# Patient Record
Sex: Female | Born: 1970 | Race: Black or African American | Hispanic: No | Smoking: Current every day smoker
Health system: Southern US, Community
[De-identification: ages and names within clinical notes are randomized; demographics above are authoritative.]

## PROBLEM LIST (undated history)

## (undated) DIAGNOSIS — I10 Essential (primary) hypertension: Secondary | ICD-10-CM

## (undated) DIAGNOSIS — E079 Disorder of thyroid, unspecified: Secondary | ICD-10-CM

## (undated) DIAGNOSIS — E119 Type 2 diabetes mellitus without complications: Secondary | ICD-10-CM

## (undated) HISTORY — PX: BREAST CYST ASPIRATION: SHX578

## (undated) HISTORY — PX: TUBAL LIGATION: SHX77

---

## 2004-09-09 ENCOUNTER — Ambulatory Visit: Payer: Self-pay | Admitting: Family Medicine

## 2004-09-28 ENCOUNTER — Ambulatory Visit: Payer: Self-pay | Admitting: Unknown Physician Specialty

## 2004-09-30 ENCOUNTER — Ambulatory Visit: Payer: Self-pay

## 2004-12-12 ENCOUNTER — Ambulatory Visit: Payer: Self-pay | Admitting: Family Medicine

## 2004-12-26 ENCOUNTER — Ambulatory Visit: Payer: Self-pay | Admitting: Family Medicine

## 2005-03-31 ENCOUNTER — Ambulatory Visit: Payer: Self-pay | Admitting: Family Medicine

## 2005-04-05 ENCOUNTER — Ambulatory Visit: Payer: Self-pay | Admitting: Family Medicine

## 2005-06-15 ENCOUNTER — Ambulatory Visit: Payer: Self-pay | Admitting: Family Medicine

## 2005-06-28 ENCOUNTER — Ambulatory Visit: Payer: Self-pay | Admitting: Surgery

## 2005-08-09 ENCOUNTER — Ambulatory Visit: Payer: Self-pay | Admitting: Physician Assistant

## 2005-10-12 ENCOUNTER — Ambulatory Visit: Payer: Self-pay | Admitting: Pain Medicine

## 2005-10-18 ENCOUNTER — Ambulatory Visit: Payer: Self-pay | Admitting: Pain Medicine

## 2005-10-20 ENCOUNTER — Ambulatory Visit: Payer: Self-pay | Admitting: Obstetrics and Gynecology

## 2006-09-19 ENCOUNTER — Emergency Department: Payer: Self-pay | Admitting: Emergency Medicine

## 2008-01-18 ENCOUNTER — Emergency Department: Payer: Self-pay | Admitting: Internal Medicine

## 2008-01-27 ENCOUNTER — Emergency Department: Payer: Self-pay | Admitting: Emergency Medicine

## 2008-08-03 ENCOUNTER — Ambulatory Visit: Payer: Self-pay | Admitting: Family Medicine

## 2009-01-12 ENCOUNTER — Ambulatory Visit: Payer: Self-pay | Admitting: Gynecologic Oncology

## 2009-02-09 ENCOUNTER — Ambulatory Visit: Payer: Self-pay | Admitting: Gynecologic Oncology

## 2009-05-03 ENCOUNTER — Emergency Department: Payer: Self-pay | Admitting: Emergency Medicine

## 2010-06-14 ENCOUNTER — Ambulatory Visit: Payer: Self-pay | Admitting: Internal Medicine

## 2010-07-06 ENCOUNTER — Inpatient Hospital Stay: Payer: Self-pay | Admitting: Internal Medicine

## 2010-07-14 ENCOUNTER — Ambulatory Visit: Payer: Self-pay | Admitting: Internal Medicine

## 2011-11-21 ENCOUNTER — Ambulatory Visit: Payer: Self-pay

## 2012-01-04 ENCOUNTER — Ambulatory Visit: Payer: Self-pay | Admitting: General Surgery

## 2012-01-04 LAB — BASIC METABOLIC PANEL
Anion Gap: 7 (ref 7–16)
BUN: 19 mg/dL — ABNORMAL HIGH (ref 7–18)
Calcium, Total: 8.5 mg/dL (ref 8.5–10.1)
Chloride: 109 mmol/L — ABNORMAL HIGH (ref 98–107)
Co2: 23 mmol/L (ref 21–32)
Creatinine: 0.83 mg/dL (ref 0.60–1.30)
Osmolality: 286 (ref 275–301)

## 2012-01-04 LAB — CBC WITH DIFFERENTIAL/PLATELET
Basophil #: 0 10*3/uL (ref 0.0–0.1)
Basophil %: 0.4 %
Eosinophil #: 0.1 10*3/uL (ref 0.0–0.7)
Eosinophil %: 1.4 %
HGB: 12.3 g/dL (ref 12.0–16.0)
Lymphocyte %: 43.6 %
MCH: 32.6 pg (ref 26.0–34.0)
MCV: 99 fL (ref 80–100)
Monocyte #: 0.4 x10 3/mm (ref 0.2–0.9)
Monocyte %: 7 %
WBC: 5.2 10*3/uL (ref 3.6–11.0)

## 2012-01-05 ENCOUNTER — Ambulatory Visit: Payer: Self-pay | Admitting: General Surgery

## 2012-01-05 DIAGNOSIS — Z01810 Encounter for preprocedural cardiovascular examination: Secondary | ICD-10-CM

## 2012-01-05 LAB — PREGNANCY, URINE: Pregnancy Test, Urine: NEGATIVE m[IU]/mL

## 2012-01-08 LAB — PATHOLOGY REPORT

## 2013-03-27 ENCOUNTER — Emergency Department: Payer: Self-pay | Admitting: Emergency Medicine

## 2014-12-06 NOTE — Op Note (Signed)
PATIENT NAME:  Tammy BodilyMEBANE, [name] MR#:  [id number] DATE OF BIRTH:  [birth year]  DATE OF PROCEDURE:  01/05/2012  PREOPERATIVE DIAGNOSIS: Left breast abscess.   POSTOPERATIVE DIAGNOSIS: Left breast abscess.   OPERATIVE PROCEDURE: Excision of left breast abscess with central duct.   SURGEON: Donnalee CurryJeffrey Wacey Zieger, MD   ANESTHESIA: General by LMA under Dr. Rubye OaksPalmer, Marcaine 0.5% plain, 30 mL local infiltration.   ANESTHESIOLOGIST: Heriberto AntiguaScott Palmer, MD   CLINICAL NOTE: This 44 year old woman has developed a recurrent left breast abscess. She was felt to be a candidate for excision due to marked local pain.   OPERATIVE NOTE: With the patient under adequate general anesthesia, the area was prepped with ChloraPrep and draped. Marcaine was infiltrated for postoperative analgesia. The mass was at the 11-12 o'clock position beneath the areola. A radial incision was made along this course from the base of the nipple to the edge of the areola. Skin was incised sharply and the remaining dissection completed with electrocautery. The inflammatory process was entered and a small amount of purulence obtained. Culture was sent for aerobic and anaerobic organisms. The inflammatory tissue was incised in its entirety. A lacrimal duct probe was passed through the connecting duct, and then a 3.5 mm punch biopsy was used to extract the duct from the adjacent tissue. This entire specimen was sent in formalin for routine histology. This nipple end was tagged with a 2-0 Vicryl suture. Excellent hemostasis was achieved. The area was irrigated with saline. The wound was closed in layers with 2-0 Vicryl figure-of-eight sutures. The skin was loosely approximated with 4-0 nylon sutures. A dry dressing with Telfa and Tegaderm was applied.       The patient tolerated the procedure well and was taken to the recovery room in stable condition. ____________________________ Earline MayotteJeffrey W. Reign Bartnick, MD jwb:cbb D: 01/05/2012 12:11:30 ET T: 01/05/2012  19:52:05 ET JOB#: [id number]  cc: Earline MayotteJeffrey W. Abbegayle Denault, MD, <Dictator> Leanna SatoLinda M. Miles, MD [facility] - [facility] Shaver Durk Carmen Brion AlimentW Riordan Walle MD ELECTRONICALLY SIGNED 01/08/2012 12:34

## 2015-11-11 ENCOUNTER — Encounter: Payer: Self-pay | Admitting: Emergency Medicine

## 2015-11-11 ENCOUNTER — Emergency Department
Admission: EM | Admit: 2015-11-11 | Discharge: 2015-11-11 | Disposition: A | Discharge status: Home / Self Care | Payer: BLUE CROSS/BLUE SHIELD | Attending: Emergency Medicine | Admitting: Emergency Medicine

## 2015-11-11 DIAGNOSIS — I10 Essential (primary) hypertension: Secondary | ICD-10-CM | POA: Insufficient documentation

## 2015-11-11 DIAGNOSIS — J0140 Acute pansinusitis, unspecified: Secondary | ICD-10-CM

## 2015-11-11 DIAGNOSIS — F17210 Nicotine dependence, cigarettes, uncomplicated: Secondary | ICD-10-CM | POA: Diagnosis not present

## 2015-11-11 DIAGNOSIS — E119 Type 2 diabetes mellitus without complications: Secondary | ICD-10-CM | POA: Diagnosis not present

## 2015-11-11 DIAGNOSIS — R0981 Nasal congestion: Secondary | ICD-10-CM | POA: Diagnosis present

## 2015-11-11 HISTORY — DX: Type 2 diabetes mellitus without complications: E11.9

## 2015-11-11 HISTORY — DX: Disorder of thyroid, unspecified: E07.9

## 2015-11-11 HISTORY — DX: Essential (primary) hypertension: I10

## 2015-11-11 MED ORDER — AMOXICILLIN-POT CLAVULANATE 875-125 MG PO TABS
1.0000 | ORAL_TABLET | Freq: Once | ORAL | Status: AC
  Administered 2015-11-11: 1 via ORAL
  Filled 2015-11-11: qty 1

## 2015-11-11 MED ORDER — HYDROCODONE-ACETAMINOPHEN 5-325 MG PO TABS: 1.0000 | ORAL_TABLET | ORAL | Status: DC | PRN

## 2015-11-11 MED ORDER — AMOXICILLIN-POT CLAVULANATE 875-125 MG PO TABS: 1.0000 | ORAL_TABLET | Freq: Two times a day (BID) | ORAL | Status: AC

## 2015-11-11 NOTE — ED Notes (Signed)
Patient discharged to home per MD order. Patient in stable condition, and deemed medically cleared by ED provider for discharge. Discharge instructions reviewed with patient/family using "Teach Back"; verbalized understanding of medication education and administration, and information about follow-up care. Denies further concerns. ° °

## 2015-11-11 NOTE — Discharge Instructions (Signed)
Sinusitis, Adult Sinusitis is redness, soreness, and puffiness (inflammation) of the air pockets in the bones of your face (sinuses). The redness, soreness, and puffiness can cause air and mucus to get trapped in your sinuses. This can allow germs to grow and cause an infection.  HOME CARE   Drink enough fluids to keep your pee (urine) clear or pale yellow.  Use a humidifier in your home.  Run a hot shower to create steam in the bathroom. Sit in the bathroom with the door closed. Breathe in the steam 3-4 times a day.  Put a warm, moist washcloth on your face 3-4 times a day, or as told by your doctor.  Use salt water sprays (saline sprays) to wet the thick fluid in your nose. This can help the sinuses drain.  Only take medicine as told by your doctor. GET HELP RIGHT AWAY IF:   Your pain gets worse.  You have very bad headaches.  You are sick to your stomach (nauseous).  You throw up (vomit).  You are very sleepy (drowsy) all the time.  Your face is puffy (swollen).  Your vision changes.  You have a stiff neck.  You have trouble breathing. MAKE SURE YOU:   Understand these instructions.  Will watch your condition.  Will get help right away if you are not doing well or get worse.   This information is not intended to replace advice given to you by your health care provider. Make sure you discuss any questions you have with your health care provider.   Document Released: 01/17/2008 Document Revised: 08/21/2014 Document Reviewed: 03/05/2012 Elsevier Interactive Patient Education 2016 Elsevier Inc.   Take Coricidin HBP if needed for congestion as this will not elevated blood pressure. Discontinue taking Sudafed. Continue taking blood pressure medicine daily. Follow-up with your doctor if any continued problems. Begin taking Augmentin twice a day until finished. You may take Tylenol or ibuprofen as needed for pain or headache.

## 2015-11-11 NOTE — ED Notes (Signed)
Pt presents to ED with sinus pain, congestion, dry cough, and ear pain. Pt states she has been feeling badly all day and thinks she may have a sinus infection. Denies fever.

## 2015-11-11 NOTE — ED Provider Notes (Signed)
[facility] Emergency Department Provider Note ____________________________________________  Time seen: Approximately 10:59 PM  I have reviewed the triage vital signs and the nursing notes.   HISTORY  Chief Complaint Facial Pain; Otalgia; and Nasal Congestion   HPI Tammy Costa is a 45 y.o. female is here complaining of sinus pain, congestion, nonproductive cough and ear pain. Patient states that she has been feeling bad all day and has been unaware of any fever or chills. She states that at approximately 5:00 she took Sudafed 30 mg tablets 2 by mouth for congestion despite the fact that she has hypertension. She states she has taken her blood pressure medicine today. She also states that along with her facial pain she is having dental pain. Currently she rates her pain as a 10 over 10 and states she left work tonight come to the emergency room.   Past Medical History  Diagnosis Date  . Diabetes mellitus without complication (HCC)   . Hypertension   . Thyroid disease     There are no active problems to display for this patient.   Past Surgical History  Procedure Laterality Date  . Tubal ligation      Current Outpatient Rx  Name  Route  Sig  Dispense  Refill  . amoxicillin-clavulanate (AUGMENTIN) 875-125 MG tablet   Oral   Take 1 tablet by mouth 2 (two) times daily.   20 tablet   0   . HYDROcodone-acetaminophen (NORCO/VICODIN) 5-325 MG tablet   Oral   Take 1 tablet by mouth every 4 (four) hours as needed for moderate pain.   20 tablet   0     Allergies Sulfa antibiotics  No family history on file.  Social History Social History  Substance Use Topics  . Smoking status: Current Every Day Smoker    Types: Cigarettes  . Smokeless tobacco: None  . Alcohol Use: Yes    Review of Systems Constitutional: No fever/chills ENT: No sore throat. Cardiovascular: Denies chest pain. Respiratory: Denies shortness of breath. Positive  nonproductive cough. Gastrointestinal:  No nausea, no vomiting.   Musculoskeletal: Negative for back pain. Skin: Negative for rash. Neurological: Positive for frontal headaches, no focal weakness or numbness.  10-point ROS otherwise negative.  ____________________________________________   PHYSICAL EXAM:  VITAL SIGNS: ED Triage Vitals  Enc Vitals Group     BP 11/11/15 2201 160/98 mmHg     Pulse Rate 11/11/15 2201 97     Resp 11/11/15 2201 20     Temp 11/11/15 2201 98.3 F (36.8 C)     Temp Source 11/11/15 2201 Oral     SpO2 11/11/15 2201 97 %     Weight 11/11/15 2201 156 lb (70.761 kg)     Height 11/11/15 2201  (1.651 m)     Head Cir --      Peak Flow --      Pain Score 11/11/15 2201 10     Pain Loc --      Pain Edu? --      Excl. in GC? --     Constitutional: Alert and oriented. Well appearing and in no acute distress. Eyes: Conjunctivae are normal. PERRL. EOMI. Head: Atraumatic. Nose: Moderate congestion/rhinnorhea. EACs are clear. TMs are dull with poor light reflex. No injection or erythema was noted. Moderate tenderness on percussion of the frontal and maxillary sinuses Mouth/Throat: Mucous membranes are moist.  Oropharynx non-erythematous. Positive posterior drainage. Neck: No stridor.   Hematological/Lymphatic/Immunilogical: No cervical lymphadenopathy. Cardiovascular: Normal  rate, regular rhythm. Grossly normal heart sounds.  Good peripheral circulation. Respiratory: Normal respiratory effort.  No retractions. Lungs CTAB. Musculoskeletal: Moves upper and lower extremities without any difficulty. Normal gait was noted. Neurologic:  Normal speech and language. No gross focal neurologic deficits are appreciated. No gait instability. Skin:  Skin is warm, dry and intact. No rash noted. Psychiatric: Mood and affect are normal. Speech and behavior are normal.  ____________________________________________   LABS (all labs ordered are listed, but only abnormal  results are displayed)  Labs Reviewed - No data to display  PROCEDURES  Procedure(s) performed: None  Critical Care performed: No  ____________________________________________   INITIAL IMPRESSION / ASSESSMENT AND PLAN / ED COURSE  Pertinent labs & imaging results that were available during my care of the patient were reviewed by me and considered in my medical decision making (see chart for details).  Patient was started on Augmentin 875 mg twice a day for 10 days. She is also given a prescription for Norco as needed for pain. She requested a note to remain out of work. She [facility] if any continued problems. ____________________________________________   FINAL CLINICAL IMPRESSION(S) / ED DIAGNOSES  Final diagnoses:  Acute pansinusitis, recurrence not specified      Tommi RumpsRhonda L Shubham Thackston, PA-C 11/11/15 2318  Phineas SemenGraydon Goodman, MD 11/13/15 [phone]

## 2015-12-09 ENCOUNTER — Other Ambulatory Visit: Payer: Self-pay | Admitting: Nurse Practitioner

## 2015-12-09 DIAGNOSIS — Z1231 Encounter for screening mammogram for malignant neoplasm of breast: Secondary | ICD-10-CM

## 2016-06-23 ENCOUNTER — Ambulatory Visit: Payer: BLUE CROSS/BLUE SHIELD | Attending: Otolaryngology

## 2016-06-23 DIAGNOSIS — I10 Essential (primary) hypertension: Secondary | ICD-10-CM | POA: Diagnosis not present

## 2016-06-23 DIAGNOSIS — E669 Obesity, unspecified: Secondary | ICD-10-CM | POA: Diagnosis not present

## 2016-06-23 DIAGNOSIS — G4733 Obstructive sleep apnea (adult) (pediatric): Secondary | ICD-10-CM | POA: Diagnosis not present

## 2016-06-23 DIAGNOSIS — Z6841 Body Mass Index (BMI) 40.0 and over, adult: Secondary | ICD-10-CM | POA: Diagnosis not present

## 2016-06-23 DIAGNOSIS — R0683 Snoring: Secondary | ICD-10-CM | POA: Diagnosis present

## 2016-07-25 ENCOUNTER — Other Ambulatory Visit: Payer: Self-pay | Admitting: Family Medicine

## 2016-07-25 DIAGNOSIS — Z1239 Encounter for other screening for malignant neoplasm of breast: Secondary | ICD-10-CM

## 2016-09-12 ENCOUNTER — Other Ambulatory Visit: Payer: Self-pay | Admitting: Family Medicine

## 2016-10-10 ENCOUNTER — Ambulatory Visit
Admission: RE | Admit: 2016-10-10 | Discharge: 2016-10-10 | Disposition: A | Discharge status: Home / Self Care | Payer: 59 | Source: Ambulatory Visit | Attending: Family Medicine | Admitting: Family Medicine

## 2016-10-10 DIAGNOSIS — Z1231 Encounter for screening mammogram for malignant neoplasm of breast: Secondary | ICD-10-CM | POA: Insufficient documentation

## 2016-10-10 DIAGNOSIS — Z1239 Encounter for other screening for malignant neoplasm of breast: Secondary | ICD-10-CM

## 2017-11-21 ENCOUNTER — Other Ambulatory Visit: Payer: Self-pay | Admitting: Family Medicine

## 2017-11-21 DIAGNOSIS — Z1231 Encounter for screening mammogram for malignant neoplasm of breast: Secondary | ICD-10-CM

## 2017-12-05 ENCOUNTER — Ambulatory Visit
Admission: RE | Admit: 2017-12-05 | Discharge: 2017-12-05 | Disposition: A | Discharge status: Home / Self Care | Payer: BLUE CROSS/BLUE SHIELD | Source: Ambulatory Visit | Attending: Family Medicine | Admitting: Family Medicine

## 2017-12-05 DIAGNOSIS — Z1231 Encounter for screening mammogram for malignant neoplasm of breast: Secondary | ICD-10-CM | POA: Insufficient documentation

## 2018-12-04 ENCOUNTER — Other Ambulatory Visit: Payer: Self-pay | Admitting: Family Medicine

## 2018-12-04 DIAGNOSIS — Z1231 Encounter for screening mammogram for malignant neoplasm of breast: Secondary | ICD-10-CM

## 2019-01-21 ENCOUNTER — Ambulatory Visit
Admission: RE | Admit: 2019-01-21 | Discharge: 2019-01-21 | Disposition: A | Discharge status: Home / Self Care | Payer: BC Managed Care – PPO | Source: Ambulatory Visit | Attending: Family Medicine | Admitting: Family Medicine

## 2019-01-21 ENCOUNTER — Other Ambulatory Visit: Payer: Self-pay

## 2019-01-21 DIAGNOSIS — Z1231 Encounter for screening mammogram for malignant neoplasm of breast: Secondary | ICD-10-CM | POA: Insufficient documentation

## 2019-07-14 ENCOUNTER — Other Ambulatory Visit: Payer: Self-pay

## 2019-07-14 DIAGNOSIS — Z20822 Contact with and (suspected) exposure to covid-19: Secondary | ICD-10-CM

## 2019-07-16 LAB — NOVEL CORONAVIRUS, NAA: SARS-CoV-2, NAA: NOT DETECTED

## 2019-09-19 ENCOUNTER — Ambulatory Visit: Payer: Self-pay | Admitting: Urology

## 2019-10-25 ENCOUNTER — Other Ambulatory Visit: Payer: Self-pay

## 2019-10-25 ENCOUNTER — Ambulatory Visit: Payer: Self-pay | Attending: Internal Medicine

## 2019-10-25 DIAGNOSIS — Z23 Encounter for immunization: Secondary | ICD-10-CM

## 2019-10-25 NOTE — Progress Notes (Signed)
   Covid-19 Vaccination Clinic  Name:  Tammy Costa    MRN: [id number] DOB: Dec 21, 1970  10/25/2019  Tammy Costa was observed post Covid-19 immunization for 15 minutes without incident. She was provided with Vaccine Information Sheet and instruction to access the V-Safe system.   Tammy Costa was instructed to call 911 with any severe reactions post vaccine: Marland Kitchen Difficulty breathing  . Swelling of face and throat  . A fast heartbeat  . A bad rash all over body  . Dizziness and weakness   Immunizations Administered    Name Date Dose VIS Date Route   Pfizer COVID-19 Vaccine 10/25/2019 10:17 AM 0.3 mL 07/25/2019 Intramuscular   Manufacturer: [employer], [employer]   Lot: [id number]   NDC: [address]-1

## 2019-11-08 ENCOUNTER — Ambulatory Visit: Payer: BC Managed Care – PPO

## 2019-11-19 ENCOUNTER — Ambulatory Visit: Payer: Self-pay | Attending: Internal Medicine

## 2019-11-19 DIAGNOSIS — Z23 Encounter for immunization: Secondary | ICD-10-CM

## 2019-11-19 NOTE — Progress Notes (Signed)
   Covid-19 Vaccination Clinic  Name:  PHILLIPA MORDEN    MRN: [id number] DOB: [birth year]  11/19/2019  Ms. Mebane-Killough was observed post Covid-19 immunization for 15 minutes without incident. She was provided with Vaccine Information Sheet and instruction to access the V-Safe system.   Ms. Dimon was instructed to call 911 with any severe reactions post vaccine: Marland Kitchen Difficulty breathing  . Swelling of face and throat  . A fast heartbeat  . A bad rash all over body  . Dizziness and weakness   Immunizations Administered    Name Date Dose VIS Date Route   Pfizer COVID-19 Vaccine 11/19/2019  9:35 AM 0.3 mL 07/25/2019 Intramuscular   Manufacturer: [employer], [employer]   Lot: [id number] [id number]   NDC: [address]-7

## 2020-01-06 ENCOUNTER — Other Ambulatory Visit: Payer: Self-pay | Admitting: Family Medicine

## 2020-01-06 DIAGNOSIS — Z1231 Encounter for screening mammogram for malignant neoplasm of breast: Secondary | ICD-10-CM

## 2020-01-23 ENCOUNTER — Ambulatory Visit
Admission: RE | Admit: 2020-01-23 | Discharge: 2020-01-23 | Disposition: A | Discharge status: Home / Self Care | Payer: BC Managed Care – PPO | Source: Ambulatory Visit | Attending: Family Medicine | Admitting: Family Medicine

## 2020-01-23 DIAGNOSIS — Z1231 Encounter for screening mammogram for malignant neoplasm of breast: Secondary | ICD-10-CM

## 2020-01-27 ENCOUNTER — Other Ambulatory Visit: Payer: Self-pay | Admitting: Family Medicine

## 2020-01-27 DIAGNOSIS — R928 Other abnormal and inconclusive findings on diagnostic imaging of breast: Secondary | ICD-10-CM

## 2020-01-27 DIAGNOSIS — R921 Mammographic calcification found on diagnostic imaging of breast: Secondary | ICD-10-CM

## 2020-01-30 ENCOUNTER — Other Ambulatory Visit: Payer: Self-pay

## 2020-01-30 ENCOUNTER — Encounter: Payer: Self-pay | Admitting: Urology

## 2020-01-30 ENCOUNTER — Ambulatory Visit: Payer: BC Managed Care – PPO | Admitting: Urology

## 2020-01-30 DIAGNOSIS — R350 Frequency of micturition: Secondary | ICD-10-CM | POA: Diagnosis not present

## 2020-01-30 LAB — BLADDER SCAN AMB NON-IMAGING: Scan Result: 0

## 2020-01-30 MED ORDER — UROGESIC-BLUE 81.6 MG PO TABS: 1.0000 | ORAL_TABLET | Freq: Three times a day (TID) | ORAL | 3 refills | Status: AC | PRN

## 2020-01-30 NOTE — Patient Instructions (Signed)
Cystoscopy Cystoscopy is a procedure that is used to help diagnose and sometimes treat conditions that affect the lower urinary tract. The lower urinary tract includes the bladder and the urethra. The urethra is the tube that drains urine from the bladder. Cystoscopy is done using a thin, tube-shaped instrument with a light and camera at the end (cystoscope). The cystoscope may be hard or flexible, depending on the goal of the procedure. The cystoscope is inserted through the urethra, into the bladder. Cystoscopy may be recommended if you have:  Urinary tract infections that keep coming back.  Blood in the urine (hematuria).  An inability to control when you urinate (urinary incontinence) or an overactive bladder.  Unusual cells found in a urine sample.  A blockage in the urethra, such as a urinary stone.  Painful urination.  An abnormality in the bladder found during an intravenous pyelogram (IVP) or CT scan. Cystoscopy may also be done to remove a sample of tissue to be examined under a microscope (biopsy). Tell a health care provider about:  Any allergies you have.  All medicines you are taking, including vitamins, herbs, eye drops, creams, and over-the-counter medicines.  Any problems you or family members have had with anesthetic medicines.  Any blood disorders you have.  Any surgeries you have had.  Any medical conditions you have.  Whether you are pregnant or may be pregnant. What are the risks? Generally, this is a safe procedure. However, problems may occur, including:  Infection.  Bleeding.  Allergic reactions to medicines.  Damage to other structures or organs. What happens before the procedure?  Ask your health care provider about: ? Changing or stopping your regular medicines. This is especially important if you are taking diabetes medicines or blood thinners. ? Taking medicines such as aspirin and ibuprofen. These medicines can thin your blood. Do not take  these medicines unless your health care provider tells you to take them. ? Taking over-the-counter medicines, vitamins, herbs, and supplements.  Follow instructions from your health care provider about eating or drinking restrictions.  Ask your health care provider what steps will be taken to help prevent infection. These may include: ? Washing skin with a germ-killing soap. ? Taking antibiotic medicine.  You may have an exam or testing, such as: ? X-rays of the bladder, urethra, or kidneys. ? Urine tests to check for signs of infection.  Plan to have someone take you home from the hospital or clinic. What happens during the procedure?   You will be given one or more of the following: ? A medicine to help you relax (sedative). ? A medicine to numb the area (local anesthetic).  The area around the opening of your urethra will be cleaned.  The cystoscope will be passed through your urethra into your bladder.  Germ-free (sterile) fluid will flow through the cystoscope to fill your bladder. The fluid will stretch your bladder so that your health care provider can clearly examine your bladder walls.  Your doctor will look at the urethra and bladder. Your doctor may take a biopsy or remove stones.  The cystoscope will be removed, and your bladder will be emptied. The procedure may vary among health care providers and hospitals. What can I expect after the procedure? After the procedure, it is common to have:  Some soreness or pain in your abdomen and urethra.  Urinary symptoms. These include: ? Mild pain or burning when you urinate. Pain should stop within a few minutes after you urinate. This   may last for up to 1 week. ? A small amount of blood in your urine for several days. ? Feeling like you need to urinate but producing only a small amount of urine. Follow these instructions at home: Medicines  Take over-the-counter and prescription medicines only as told by your health care  provider.  If you were prescribed an antibiotic medicine, take it as told by your health care provider. Do not stop taking the antibiotic even if you start to feel better. General instructions  Return to your normal activities as told by your health care provider. Ask your health care provider what activities are safe for you.  Do not drive for 24 hours if you were given a sedative during your procedure.  Watch for any blood in your urine. If the amount of blood in your urine increases, call your health care provider.  Follow instructions from your health care provider about eating or drinking restrictions.  If a tissue sample was removed for testing (biopsy) during your procedure, it is up to you to get your test results. Ask your health care provider, or the department that is doing the test, when your results will be ready.  Drink enough fluid to keep your urine pale yellow.  Keep all follow-up visits as told by your health care provider. This is important. Contact a health care provider if you:  Have pain that gets worse or does not get better with medicine, especially pain when you urinate.  Have trouble urinating.  Have more blood in your urine. Get help right away if you:  Have blood clots in your urine.  Have abdominal pain.  Have a fever or chills.  Are unable to urinate. Summary  Cystoscopy is a procedure that is used to help diagnose and sometimes treat conditions that affect the lower urinary tract.  Cystoscopy is done using a thin, tube-shaped instrument with a light and camera at the end.  After the procedure, it is common to have some soreness or pain in your abdomen and urethra.  Watch for any blood in your urine. If the amount of blood in your urine increases, call your health care provider.  If you were prescribed an antibiotic medicine, take it as told by your health care provider. Do not stop taking the antibiotic even if you start to feel better. This  information is not intended to replace advice given to you by your health care provider. Make sure you discuss any questions you have with your health care provider. Document Revised: 07/23/2018 Document Reviewed: 07/23/2018 Elsevier Patient Education  2020 Elsevier Inc. Overactive Bladder, Adult  Overactive bladder refers to a condition in which a person has a sudden need to pass urine. The person may leak urine if he or she cannot get to the bathroom fast enough (urinary incontinence). A person with this condition may also wake up several times in the night to go to the bathroom. Overactive bladder is associated with poor nerve signals between your bladder and your brain. Your bladder may get the signal to empty before it is full. You may also have very sensitive muscles that make your bladder squeeze too soon. These symptoms might interfere with daily work or social activities. What are the causes? This condition may be associated with or caused by:  Urinary tract infection.  Infection of nearby tissues, such as the prostate.  Prostate enlargement.  Surgery on the uterus or urethra.  Bladder stones, inflammation, or tumors.  Drinking too much caffeine   or alcohol.  Certain medicines, especially medicines that get rid of extra fluid in the body (diuretics).  Muscle or nerve weakness, especially from: ? A spinal cord injury. ? Stroke. ? Multiple sclerosis. ? Parkinson's disease.  Diabetes.  Constipation. What increases the risk? You may be at greater risk for overactive bladder if you:  Are an older adult.  Smoke.  Are going through menopause.  Have prostate problems.  Have a neurological disease, such as stroke, dementia, Parkinson's disease, or multiple sclerosis (MS).  Eat or drink things that irritate the bladder. These include alcohol, spicy food, and caffeine.  Are overweight or obese. What are the signs or symptoms? Symptoms of this condition  include:  Sudden, strong urge to urinate.  Leaking urine.  Urinating 8 or more times a day.  Waking up to urinate 2 or more times a night. How is this diagnosed? Your health care provider may suspect overactive bladder based on your symptoms. He or she will diagnose this condition by:  A physical exam and medical history.  Blood or urine tests. You might need bladder or urine tests to help determine what is causing your overactive bladder. You might also need to see a health care provider who specializes in urinary tract problems (urologist). How is this treated? Treatment for overactive bladder depends on the cause of your condition and whether it is mild or severe. You can also make lifestyle changes at home. Options include:  Bladder training. This may include: ? Learning to control the urge to urinate by following a schedule that directs you to urinate at regular intervals (timed voiding). ? Doing Kegel exercises to strengthen your pelvic floor muscles, which support your bladder. Toning these muscles can help you control urination, even if your bladder muscles are overactive.  Special devices. This may include: ? Biofeedback, which uses sensors to help you become aware of your body's signals. ? Electrical stimulation, which uses electrodes placed inside the body (implanted) or outside the body. These electrodes send gentle pulses of electricity to strengthen the nerves or muscles that control the bladder. ? Women may use a plastic device that fits into the vagina and supports the bladder (pessary).  Medicines. ? Antibiotics to treat bladder infection. ? Antispasmodics to stop the bladder from releasing urine at the wrong time. ? Tricyclic antidepressants to relax bladder muscles. ? Injections of botulinum toxin type A directly into the bladder tissue to relax bladder muscles.  Lifestyle changes. This may include: ? Weight loss. Talk to your health care provider about weight  loss methods that would work best for you. ? Diet changes. This may include reducing how much alcohol and caffeine you consume, or drinking fluids at different times of the day. ? Not smoking. Do not use any products that contain nicotine or tobacco, such as cigarettes and e-cigarettes. If you need help quitting, ask your health care provider.  Surgery. ? A device may be implanted to help manage the nerve signals that control urination. ? An electrode may be implanted to stimulate electrical signals in the bladder. ? A procedure may be done to change the shape of the bladder. This is done only in very severe cases. Follow these instructions at home: Lifestyle  Make any diet or lifestyle changes that are recommended by your health care provider. These may include: ? Drinking less fluid or drinking fluids at different times of the day. ? Cutting down on caffeine or alcohol. ? Doing Kegel exercises. ? Losing weight if needed. ?   Eating a healthy and balanced diet to prevent constipation. This may include:  Eating foods that are high in fiber, such as fresh fruits and vegetables, whole grains, and beans.  Limiting foods that are high in fat and processed sugars, such as fried and sweet foods. General instructions  Take over-the-counter and prescription medicines only as told by your health care provider.  If you were prescribed an antibiotic medicine, take it as told by your health care provider. Do not stop taking the antibiotic even if you start to feel better.  Use any implants or pessary as told by your health care provider.  If needed, wear pads to absorb urine leakage.  Keep a journal or log to track how much and when you drink and when you feel the need to urinate. This will help your health care provider monitor your condition.  Keep all follow-up visits as told by your health care provider. This is important. Contact a health care provider if:  You have a fever.  Your  symptoms do not get better with treatment.  Your pain and discomfort get worse.  You have more frequent urges to urinate. Get help right away if:  You are not able to control your bladder. Summary  Overactive bladder refers to a condition in which a person has a sudden need to pass urine.  Several conditions may lead to an overactive bladder.  Treatment for overactive bladder depends on the cause and severity of your condition.  Follow your health care provider's instructions about lifestyle changes, doing Kegel exercises, keeping a journal, and taking medicines. This information is not intended to replace advice given to you by your health care provider. Make sure you discuss any questions you have with your health care provider. Document Revised: 11/21/2018 Document Reviewed: 08/16/2017 Elsevier Patient Education  2020 Elsevier Inc.  

## 2020-01-30 NOTE — Progress Notes (Signed)
01/30/2020 9:10 AM   Tammy Costa 06-Feb-1971 [id number]  Referring provider: Leanna Sato, MD [address] [place],  [place] [address]  Chief Complaint  Patient presents with   New Patient (Initial Visit)    UTI    HPI: Tammy Costa was referred over for frequency, urgency and "heaviness" in the pelvic area. No prolapse symptoms. She may have had a UTI. No dysuria. Pyridium hasn't helped. She was treated with nitrofurantoin, cephalexin and cipro but her symptoms improved. 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 A follow-up UA showed negative nitrite, negative leukocyte esterase, 3+ glucose.  Culture was mixed.  She has no gross hematuria. Good flow. She has a yearly Gyn exam.   No NG risk. She has diabetes mellitus. Pelvic surgery includes tubal ligation. # NSVD.   UA today with a few bacteria. No rbcs. PVR 0 ml.    PMH: Past Medical History:  Diagnosis Date   Diabetes mellitus without complication (HCC)    Hypertension    Thyroid disease     Surgical History: Past Surgical History:  Procedure Laterality Date   BREAST CYST ASPIRATION Left    neg   TUBAL LIGATION      Home Medications:  Allergies as of 01/30/2020      Reactions   Sulfa Antibiotics Rash, Itching, Swelling   Sulfasalazine Rash      Medication List       Accurate as of January 30, 2020  9:10 AM. If you have any questions, ask your nurse or doctor.        HYDROcodone-acetaminophen 5-325 MG tablet Commonly known as: NORCO/VICODIN Take 1 tablet by mouth every 4 (four) hours as needed for moderate pain.       Allergies:  Allergies  Allergen Reactions   Sulfa Antibiotics Rash, Itching and Swelling   Sulfasalazine Rash    Family History: Family History  Problem Relation Age of Onset   Breast cancer Maternal Aunt     Social History:  reports that she has been smoking cigarettes. She has never used smokeless tobacco. She reports current alcohol use. No history on file for  drug use.   Physical Exam: There were no vitals taken for this visit.  Constitutional:  Alert and oriented, No acute distress. HEENT: [place] AT, moist mucus membranes.  Trachea midline, no masses. Cardiovascular: No clubbing, cyanosis, or edema. Respiratory: Normal respiratory effort, no increased work of breathing. GI: Abdomen is soft, nontender, nondistended, no abdominal masses GU: No CVA tenderness Lymph: No cervical or inguinal lymphadenopathy. Skin: No rashes, bruises or suspicious lesions. Neurologic: Grossly intact, no focal deficits, moving all 4 extremities. Psychiatric: Normal mood and affect.  Laboratory Data: Lab Results  Component Value Date   WBC 5.2 01/04/2012   HGB 12.3 01/04/2012   HCT 37.3 01/04/2012   MCV 99 01/04/2012   PLT 308 01/04/2012    Lab Results  Component Value Date   CREATININE 0.83 01/04/2012    No results found for: PSA  No results found for: TESTOSTERONE  No results found for: HGBA1C  Urinalysis No results found for: COLORURINE, APPEARANCEUR, LABSPEC, PHURINE, GLUCOSEU, HGBUR, BILIRUBINUR, KETONESUR, PROTEINUR, UROBILINOGEN, NITRITE, LEUKOCYTESUR  No results found for: LABMICR, WBCUA, RBCUA, LABEPIT, MUCUS, BACTERIA  Pertinent Imaging: N/a  No results found for this or any previous visit.  No results found for this or any previous visit.  No results found for this or any previous visit.  No results found for this or any previous  visit.  No results found for this or any previous visit.  No results found for this or any previous visit.  No results found for this or any previous visit.  No results found for this or any previous visit.   Assessment & Plan:    1. Frequency and urgency - discussed OAB meds or Uribel. Will try some Uribel. F/u for exam and cystoscopy.  - Urinalysis, Complete - BLADDER SCAN AMB NON-IMAGING   No follow-ups on file.  Festus Aloe, MD  [facility] [address] [place], [place] [address] [phone]

## 2020-02-02 LAB — URINALYSIS, COMPLETE
Bilirubin, UA: NEGATIVE
Leukocytes,UA: NEGATIVE
Nitrite, UA: POSITIVE — AB
Protein,UA: NEGATIVE
RBC, UA: NEGATIVE
Specific Gravity, UA: 1.025 (ref 1.005–1.030)
Urobilinogen, Ur: 0.2 mg/dL (ref 0.2–1.0)
pH, UA: 5.5 (ref 5.0–7.5)

## 2020-02-02 LAB — MICROSCOPIC EXAMINATION

## 2020-02-04 ENCOUNTER — Ambulatory Visit
Admission: RE | Admit: 2020-02-04 | Discharge: 2020-02-04 | Disposition: A | Discharge status: Home / Self Care | Payer: BC Managed Care – PPO | Source: Ambulatory Visit | Attending: Family Medicine | Admitting: Family Medicine

## 2020-02-04 ENCOUNTER — Telehealth: Payer: Self-pay | Admitting: Family Medicine

## 2020-02-04 DIAGNOSIS — R921 Mammographic calcification found on diagnostic imaging of breast: Secondary | ICD-10-CM

## 2020-02-04 DIAGNOSIS — R928 Other abnormal and inconclusive findings on diagnostic imaging of breast: Secondary | ICD-10-CM | POA: Diagnosis present

## 2020-02-04 NOTE — Telephone Encounter (Signed)
Patient saw [facility] on Friday in office. Looks like a urine culture was not sent. She was given Uribel in office. Patient states this is not working. She is wanting to know if a  ABX can be sen to pharmacy.

## 2020-02-04 NOTE — Telephone Encounter (Signed)
Patient called back. I moved the appointment for Cysto up.

## 2020-02-06 ENCOUNTER — Other Ambulatory Visit: Payer: Self-pay | Admitting: Urology

## 2020-02-06 ENCOUNTER — Encounter: Payer: Self-pay | Admitting: Urology

## 2020-03-05 ENCOUNTER — Other Ambulatory Visit: Payer: Self-pay | Admitting: Urology

## 2020-03-12 ENCOUNTER — Other Ambulatory Visit: Payer: Self-pay

## 2020-03-12 ENCOUNTER — Emergency Department: Payer: BC Managed Care – PPO

## 2020-03-12 ENCOUNTER — Encounter: Payer: Self-pay | Admitting: *Deleted

## 2020-03-12 DIAGNOSIS — Z79890 Hormone replacement therapy (postmenopausal): Secondary | ICD-10-CM | POA: Insufficient documentation

## 2020-03-12 DIAGNOSIS — K651 Peritoneal abscess: Secondary | ICD-10-CM | POA: Diagnosis not present

## 2020-03-12 DIAGNOSIS — Z20822 Contact with and (suspected) exposure to covid-19: Secondary | ICD-10-CM | POA: Diagnosis not present

## 2020-03-12 DIAGNOSIS — Z794 Long term (current) use of insulin: Secondary | ICD-10-CM | POA: Diagnosis not present

## 2020-03-12 DIAGNOSIS — T8143XA Infection following a procedure, organ and space surgical site, initial encounter: Secondary | ICD-10-CM | POA: Insufficient documentation

## 2020-03-12 DIAGNOSIS — E119 Type 2 diabetes mellitus without complications: Secondary | ICD-10-CM | POA: Diagnosis not present

## 2020-03-12 DIAGNOSIS — Z882 Allergy status to sulfonamides status: Secondary | ICD-10-CM | POA: Diagnosis not present

## 2020-03-12 DIAGNOSIS — Y832 Surgical operation with anastomosis, bypass or graft as the cause of abnormal reaction of the patient, or of later complication, without mention of misadventure at the time of the procedure: Secondary | ICD-10-CM | POA: Insufficient documentation

## 2020-03-12 DIAGNOSIS — R109 Unspecified abdominal pain: Secondary | ICD-10-CM | POA: Diagnosis present

## 2020-03-12 DIAGNOSIS — E039 Hypothyroidism, unspecified: Secondary | ICD-10-CM | POA: Insufficient documentation

## 2020-03-12 DIAGNOSIS — N200 Calculus of kidney: Secondary | ICD-10-CM | POA: Diagnosis not present

## 2020-03-12 DIAGNOSIS — I10 Essential (primary) hypertension: Secondary | ICD-10-CM | POA: Insufficient documentation

## 2020-03-12 DIAGNOSIS — F17210 Nicotine dependence, cigarettes, uncomplicated: Secondary | ICD-10-CM | POA: Diagnosis not present

## 2020-03-12 DIAGNOSIS — Z9884 Bariatric surgery status: Secondary | ICD-10-CM | POA: Diagnosis not present

## 2020-03-12 DIAGNOSIS — I700 Atherosclerosis of aorta: Secondary | ICD-10-CM | POA: Diagnosis not present

## 2020-03-12 DIAGNOSIS — Z79899 Other long term (current) drug therapy: Secondary | ICD-10-CM | POA: Insufficient documentation

## 2020-03-12 LAB — URINALYSIS, COMPLETE (UACMP) WITH MICROSCOPIC
Bacteria, UA: NONE SEEN
Bilirubin Urine: NEGATIVE
Glucose, UA: NEGATIVE mg/dL
Hgb urine dipstick: NEGATIVE
Ketones, ur: 20 mg/dL — AB
Leukocytes,Ua: NEGATIVE
Nitrite: NEGATIVE
Protein, ur: NEGATIVE mg/dL
Specific Gravity, Urine: 1.024 (ref 1.005–1.030)
pH: 5 (ref 5.0–8.0)

## 2020-03-12 LAB — COMPREHENSIVE METABOLIC PANEL
ALT: 15 U/L (ref 0–44)
AST: 21 U/L (ref 15–41)
Albumin: 2.8 g/dL — ABNORMAL LOW (ref 3.5–5.0)
Alkaline Phosphatase: 119 U/L (ref 38–126)
Anion gap: 8 (ref 5–15)
BUN: 10 mg/dL (ref 6–20)
CO2: 27 mmol/L (ref 22–32)
Calcium: 8.4 mg/dL — ABNORMAL LOW (ref 8.9–10.3)
Chloride: 102 mmol/L (ref 98–111)
Creatinine, Ser: 0.74 mg/dL (ref 0.44–1.00)
GFR calc Af Amer: >60 mL/min (ref >60)
GFR calc non Af Amer: >60 mL/min (ref >60)
Glucose, Bld: 228 mg/dL — ABNORMAL HIGH (ref 70–99)
Potassium: 3.9 mmol/L (ref 3.5–5.1)
Sodium: 137 mmol/L (ref 135–145)
Total Bilirubin: 1.5 mg/dL — ABNORMAL HIGH (ref 0.3–1.2)
Total Protein: 6.1 g/dL — ABNORMAL LOW (ref 6.5–8.1)

## 2020-03-12 LAB — LIPASE, BLOOD: Lipase: 16 U/L (ref 11–51)

## 2020-03-12 LAB — CBC
HCT: 32.5 % — ABNORMAL LOW (ref 36.0–46.0)
Hemoglobin: 11.4 g/dL — ABNORMAL LOW (ref 12.0–15.0)
MCH: 32.9 pg (ref 26.0–34.0)
MCHC: 35.1 g/dL (ref 30.0–36.0)
MCV: 93.7 fL (ref 80.0–100.0)
Platelets: 342 10*3/uL (ref 150–400)
RBC: 3.47 MIL/uL — ABNORMAL LOW (ref 3.87–5.11)
RDW: 16.4 % — ABNORMAL HIGH (ref 11.5–15.5)
WBC: 9.9 10*3/uL (ref 4.0–10.5)
nRBC: 0 % (ref 0.0–0.2)

## 2020-03-12 LAB — TROPONIN I (HIGH SENSITIVITY): Troponin I (High Sensitivity): 9 ng/L (ref <18)

## 2020-03-12 LAB — LACTIC ACID, PLASMA: Lactic Acid, Venous: 1 mmol/L (ref 0.5–1.9)

## 2020-03-12 MED ORDER — OXYCODONE-ACETAMINOPHEN 5-325 MG PO TABS
1.0000 | ORAL_TABLET | ORAL | Status: DC | PRN
  Administered 2020-03-12: 1 via ORAL
  Filled 2020-03-12: qty 1

## 2020-03-12 MED ORDER — ACETAMINOPHEN 325 MG PO TABS
650.0000 mg | ORAL_TABLET | Freq: Once | ORAL | Status: AC
  Administered 2020-03-12: 650 mg via ORAL
  Filled 2020-03-12: qty 2

## 2020-03-12 NOTE — ED Notes (Signed)
First Nurse: patient brought in by ems from home. Per ems patient with complaint of central upper abdominal pain that radiates to her back. Patient helped her daughter move on Sunday and had body aches. Patient was seen at Urgent Care and tested negative for Covid. Patient denies vomiting and diarrhea. Patient was given 100 mcg of fentanyl and 4 mg of Zofran by ems. EMS vital signs 92 nsr, 95% on room air, resp rate 18 and blood pressure 137/83. Finger stick blood sugar 217.

## 2020-03-12 NOTE — ED Triage Notes (Addendum)
PT to ED reporting left sided bad pain radiating to her back. No NVD or changes in urine. NO fevers reported. Pt rpeorts it feels as though her stomach is "swollen" but denies constipation or any changes to bowel patterns. Tenderness noted in triage. Pt also reporting intermittent SOB. No obvious SOB noted in triage.   Pt reports gastric bypass in October of 2020 without complications

## 2020-03-13 ENCOUNTER — Emergency Department: Payer: BC Managed Care – PPO

## 2020-03-13 ENCOUNTER — Emergency Department
Admission: EM | Admit: 2020-03-13 | Discharge: 2020-03-13 | Disposition: A | Discharge status: Other Acute Inpatient Hospital | Payer: BC Managed Care – PPO | Attending: Emergency Medicine | Admitting: Emergency Medicine

## 2020-03-13 DIAGNOSIS — L0291 Cutaneous abscess, unspecified: Secondary | ICD-10-CM

## 2020-03-13 DIAGNOSIS — T8143XA Infection following a procedure, organ and space surgical site, initial encounter: Secondary | ICD-10-CM

## 2020-03-13 LAB — PROTIME-INR
INR: 1.2 (ref 0.8–1.2)
Prothrombin Time: 14.8 seconds (ref 11.4–15.2)

## 2020-03-13 LAB — LACTIC ACID, PLASMA: Lactic Acid, Venous: 0.9 mmol/L (ref 0.5–1.9)

## 2020-03-13 LAB — SARS CORONAVIRUS 2 BY RT PCR (HOSPITAL ORDER, PERFORMED IN ~~LOC~~ HOSPITAL LAB): SARS Coronavirus 2: NEGATIVE

## 2020-03-13 LAB — GLUCOSE, CAPILLARY
Glucose-Capillary: 317 mg/dL — ABNORMAL HIGH (ref 70–99)
Glucose-Capillary: 362 mg/dL — ABNORMAL HIGH (ref 70–99)
Glucose-Capillary: 290 mg/dL — ABNORMAL HIGH (ref 70–99)

## 2020-03-13 LAB — TROPONIN I (HIGH SENSITIVITY): Troponin I (High Sensitivity): 9 ng/L (ref <18)

## 2020-03-13 LAB — APTT: aPTT: 30 seconds (ref 24–36)

## 2020-03-13 MED ORDER — LACTATED RINGERS IV SOLN: INTRAVENOUS | Status: DC

## 2020-03-13 MED ORDER — IOHEXOL 300 MG/ML  SOLN
100.0000 mL | Freq: Once | INTRAMUSCULAR | Status: AC | PRN
  Administered 2020-03-13: 100 mL via INTRAVENOUS

## 2020-03-13 MED ORDER — INSULIN ASPART 100 UNIT/ML ~~LOC~~ SOLN
8.0000 [IU] | Freq: Once | SUBCUTANEOUS | Status: AC
  Administered 2020-03-13: 8 [IU] via INTRAVENOUS
  Filled 2020-03-13: qty 1

## 2020-03-13 MED ORDER — PIPERACILLIN-TAZOBACTAM 3.375 G IVPB 30 MIN
3.3750 g | Freq: Four times a day (QID) | INTRAVENOUS | Status: DC
  Administered 2020-03-13 (×2): 3.375 g via INTRAVENOUS
  Filled 2020-03-13 (×2): qty 50

## 2020-03-13 MED ORDER — FENTANYL CITRATE (PF) 100 MCG/2ML IJ SOLN
50.0000 ug | Freq: Once | INTRAMUSCULAR | Status: AC
  Administered 2020-03-13: 50 ug via INTRAVENOUS
  Filled 2020-03-13: qty 2

## 2020-03-13 MED ORDER — MIDAZOLAM HCL 2 MG/2ML IJ SOLN
INTRAMUSCULAR | Status: AC | PRN
  Administered 2020-03-13 (×5): 1 mg via INTRAVENOUS

## 2020-03-13 MED ORDER — MIDAZOLAM HCL 2 MG/2ML IJ SOLN
INTRAMUSCULAR | Status: AC
  Filled 2020-03-13: qty 4

## 2020-03-13 MED ORDER — FENTANYL CITRATE (PF) 100 MCG/2ML IJ SOLN
INTRAMUSCULAR | Status: AC | PRN
  Administered 2020-03-13 (×5): 50 ug via INTRAVENOUS

## 2020-03-13 MED ORDER — ONDANSETRON HCL 4 MG/2ML IJ SOLN
4.0000 mg | Freq: Once | INTRAMUSCULAR | Status: AC
  Administered 2020-03-13: 4 mg via INTRAVENOUS
  Filled 2020-03-13: qty 2

## 2020-03-13 MED ORDER — SODIUM CHLORIDE 0.9% FLUSH: 5.0000 mL | Freq: Three times a day (TID) | INTRAVENOUS | Status: DC

## 2020-03-13 MED ORDER — PIPERACILLIN-TAZOBACTAM 3.375 G IVPB 30 MIN
3.3750 g | Freq: Once | INTRAVENOUS | Status: AC
  Administered 2020-03-13: 3.375 g via INTRAVENOUS
  Filled 2020-03-13: qty 50

## 2020-03-13 MED ORDER — FENTANYL CITRATE (PF) 100 MCG/2ML IJ SOLN
50.0000 ug | INTRAMUSCULAR | Status: DC | PRN
  Administered 2020-03-13 (×3): 50 ug via INTRAVENOUS
  Filled 2020-03-13 (×3): qty 2

## 2020-03-13 MED ORDER — LACTATED RINGERS IV BOLUS
1000.0000 mL | Freq: Once | INTRAVENOUS | Status: AC
  Administered 2020-03-13: 1000 mL via INTRAVENOUS

## 2020-03-13 MED ORDER — FENTANYL CITRATE (PF) 100 MCG/2ML IJ SOLN
INTRAMUSCULAR | Status: AC
  Filled 2020-03-13: qty 4

## 2020-03-13 NOTE — ED Notes (Signed)
Pt transported to CT at this time.

## 2020-03-13 NOTE — ED Provider Notes (Signed)
Patient returned from interventional radiology, the report received about 30 mL of purulent fluid from her abscess. Discussed with the patient, and informed her that neither [facility] or [facility] have beds available at this time and to consider transfer to other area hospitals such as [place] or [place] or [facility] where they have available bariatric surgeons and could potentially see her and admit her sooner. Patient at this point would prefer to remain here at [facility] in hopes a bed opens at [facility] or [facility] as her surgeon had recommended.  I think the patient's desire is reasonable, she does not demonstrate any decompensation, she has had interventional radiology to drain and started antibiotics. She is not in any acute distress or extremitas.   Sharyn Creamer, MD 03/13/20 1158

## 2020-03-13 NOTE — ED Provider Notes (Signed)
Notified patient has a available bed at [facility].  Confirmed, patient accepted by [facility].  Patient understanding agreeable with plan to transfer to [facility].  She is alert, in no distress, understanding of the plan and stable for transfer with ALS care.  Await transport at this time  Vitals:   03/13/20 1200 03/13/20 1215  BP: (!) 133/71   Pulse: (!) 111 (!) 111  Resp: 14 19  Temp:    SpO2: 96% 98%      Sharyn Creamer, MD 03/13/20 1236

## 2020-03-13 NOTE — Procedures (Signed)
Interventional Radiology Procedure Note  Procedure: Placement of a 60F drain into the intra-abdominal fluid collection yielding 20 mL frankly purulent fluid.    Complications: None  Estimated Blood Loss: None  Recommendations: - Drain to JP - Flush Q shift - Cultures sent   Signed,  Sterling Big, MD

## 2020-03-13 NOTE — ED Provider Notes (Signed)
Vitals:   03/13/20 0630 03/13/20 0700  BP: (!) 133/77 (!) 130/82  Pulse: 103 101  Resp: 15 15  Temp:    SpO2: 96% 96%     Patient is resting comfortably at chair at the bedside.  Spoke with interventional radiology who reports that there on their way in about 1 hour to evaluate for percutaneous drainage of her abscess.  She is still awaiting a bed at either [facility] or [facility].  Patient is very understanding.  She is pleasant in no distress.  Understanding of our current plan.     Sharyn Creamer, MD 03/13/20 [phone]

## 2020-03-13 NOTE — ED Notes (Signed)
Attempted to call report, informed that receiving RN was tied up and would call back. This RN left name and number for callback

## 2020-03-13 NOTE — ED Notes (Signed)
[facility], [facility], [facility] for transfer

## 2020-03-13 NOTE — ED Notes (Signed)
Pam CT to [employer]

## 2020-03-13 NOTE — Sedation Documentation (Signed)
Pt had 12 french catheter placed mid abd. 25 ml purulent fluid removed. Sample sent to lab for culture. Versed 4 mg, fentanyl 200 mcg IV. Returned to Ed for recovery

## 2020-03-13 NOTE — ED Notes (Signed)
200 fentanyl  4 versed intraprocedure sedation

## 2020-03-13 NOTE — ED Provider Notes (Signed)
Vitals:   03/13/20 1345 03/13/20 1400  BP: 127/72 109/68  Pulse: (!) 111 (!) 110  Resp: 17 18  Temp:    SpO2: 95% 94%     Reassessment completed.  Patient resting comfortably, alerts to voice conversant.  Reports pain controlled.  No acute distress.  Stable for transfer to [facility]   Sharyn Creamer, MD 03/13/20 1409

## 2020-03-13 NOTE — ED Provider Notes (Signed)
[facility] Emergency Department Provider Note  ____________________________________________  Time seen: Approximately 1:41 AM  I have reviewed the triage vital signs and the nursing notes.   HISTORY  Chief Complaint Abdominal Pain and Back Pain   HPI Tammy Costa is a 49 y.o. female with a history of diabetes, hypertension, hypothyroidism who presents for evaluation of abdominal pain.  Patient underwent bariatric surgery for weight loss in October 2020 at [facility].  A week ago she started having upper abdominal pain which initially was intermittent but has become constant and severe today.  She reports that the pain is worse in the upper quadrants but otherwise diffuse, sharp, severe, associated with nausea but no vomiting.  She has been taken Tylenol over-the-counter therefore she is not sure if she had a fever.  She has been passing flatus and having normal bowel movements.  No chest pain or shortness of breath.   Past Medical History:  Diagnosis Date  . Diabetes mellitus without complication (HCC)   . Hypertension   . Thyroid disease     There are no problems to display for this patient.   Past Surgical History:  Procedure Laterality Date  . BREAST CYST ASPIRATION Left    neg  . TUBAL LIGATION      Prior to Admission medications   Medication Sig Start Date End Date Taking? Authorizing Provider  Cholecalciferol 125 MCG (5000 UT) TABS Take by mouth daily.    [provider]  CREON 36000-114000 units CPEP capsule Take 2 capsules by mouth 3 (three) times daily with meals. 11/07/19   [provider]  cyanocobalamin 1000 MCG tablet Take 1 tablet by mouth daily.    [provider]  fluticasone (FLONASE) 50 MCG/ACT nasal spray Place 2 sprays into both nostrils as needed. 10/01/19   [provider]  insulin lispro (HUMALOG) 100 UNIT/ML KwikPen Inject 8-20 Units into the skin in the morning and at bedtime. 07/13/18    [provider]  Levothyroxine Sodium 175 MCG CAPS Take 1 capsule by mouth daily.    [provider]  losartan (COZAAR) 25 MG tablet Take 25 mg by mouth daily. 10/01/19   [provider]  Methen-Hyosc-Meth Blue-Na Phos (UROGESIC-BLUE) 81.6 MG TABS Take 1 tablet (81.6 mg total) by mouth every 8 (eight) hours as needed. 01/30/20   Jerilee Field, MD  [facility]let Lancets MISC  10/20/19   [provider]    Allergies Sulfa antibiotics and Sulfasalazine  Family History  Problem Relation Age of Onset  . Breast cancer Maternal Aunt     Social History Social History   Tobacco Use  . Smoking status: Current Every Day Smoker    Types: Cigarettes  . Smokeless tobacco: Never Used  Substance Use Topics  . Alcohol use: Yes  . Drug use: Not on file    Review of Systems  Constitutional: + fever. Eyes: Negative for visual changes. ENT: Negative for sore throat. Neck: No neck pain  Cardiovascular: Negative for chest pain. Respiratory: Negative for shortness of breath. Gastrointestinal: + abdominal pain and nausea. No vomiting or diarrhea. Genitourinary: Negative for dysuria. Musculoskeletal: Negative for back pain. Skin: Negative for rash. Neurological: Negative for headaches, weakness or numbness. Psych: No SI or HI  ____________________________________________   PHYSICAL EXAM:  VITAL SIGNS: ED Triage Vitals  Enc Vitals Group     BP 03/12/20 1959 113/69     Pulse Rate 03/12/20 1959 104     Resp 03/12/20 1959 16  Temp 03/12/20 1959 (!) 101 F (38.3 C)     Temp Source 03/12/20 1959 Oral     SpO2 03/12/20 1959 98 %     Weight 03/12/20 2001 183 lb (83 kg)     Height 03/12/20 2001 5\' 5"  (1.651 m)     Head Circumference --      Peak Flow --      Pain Score 03/12/20 2001 7     Pain Loc --      Pain Edu? --      Excl. in GC? --     Constitutional: Alert and oriented. Well appearing and in no apparent distress. HEENT:      Head:  Normocephalic and atraumatic.         Eyes: Conjunctivae are normal. Sclera is non-icteric.       Mouth/Throat: Mucous membranes are moist.       Neck: Supple with no signs of meningismus. Cardiovascular: Regular rate and rhythm. No murmurs, gallops, or rubs. 2+ symmetrical distal pulses are present in all extremities. No JVD. Respiratory: Normal respiratory effort. Lungs are clear to auscultation bilaterally. No wheezes, crackles, or rhonchi.  Gastrointestinal: Soft, diffusely tender to palpation worse in the epigastric region with localized guarding.  Musculoskeletal:  No edema, cyanosis, or erythema of extremities. Neurologic: Normal speech and language. Face is symmetric. Moving all extremities. No gross focal neurologic deficits are appreciated. Skin: Skin is warm, dry and intact. No rash noted. Psychiatric: Mood and affect are normal. Speech and behavior are normal.  ____________________________________________   LABS (all labs ordered are listed, but only abnormal results are displayed)  Labs Reviewed  COMPREHENSIVE METABOLIC PANEL - Abnormal; Notable for the following components:      Result Value   Glucose, Bld 228 (*)    Calcium 8.4 (*)    Total Protein 6.1 (*)    Albumin 2.8 (*)    Total Bilirubin 1.5 (*)    All other components within normal limits  CBC - Abnormal; Notable for the following components:   RBC 3.47 (*)    Hemoglobin 11.4 (*)    HCT 32.5 (*)    RDW 16.4 (*)    All other components within normal limits  URINALYSIS, COMPLETE (UACMP) WITH MICROSCOPIC - Abnormal; Notable for the following components:   Color, Urine AMBER (*)    APPearance HAZY (*)    Ketones, ur 20 (*)    All other components within normal limits  SARS CORONAVIRUS 2 BY RT PCR (HOSPITAL ORDER, PERFORMED IN [facility])  CULTURE, BLOOD (ROUTINE X 2)  CULTURE, BLOOD (ROUTINE X 2)  BODY FLUID CULTURE  LIPASE, BLOOD  LACTIC ACID, PLASMA  LACTIC ACID, PLASMA  PROTIME-INR  APTT    TROPONIN I (HIGH SENSITIVITY)  TROPONIN I (HIGH SENSITIVITY)   ____________________________________________  EKG  ED ECG REPORT I, 03/14/20, the attending physician, personally viewed and interpreted this ECG.  Normal sinus rhythm, rate of 99, normal intervals, normal axis, no ST elevations or depressions. ____________________________________________  RADIOLOGY  I have personally reviewed the images performed during this visit and I agree with the Radiologist's read.   Interpretation by Radiologist:  DG Chest 2 View  Result Date: 03/12/2020 CLINICAL DATA:  Intermittent shortness of breath and rib pain. Additional history provided: Left-sided abdominal pain radiating to back. EXAM: CHEST - 2 VIEW COMPARISON:  Report from prior chest radiographs 04/05/2016 (images unavailable), chest radiographs 07/06/2010. FINDINGS: The cardiac silhouette is accentuated on this shallow inspiration radiograph.  Heart size is likely within normal limits. There is mild right perihilar atelectasis. The left lung is clear. No evidence of pleural effusion or pneumothorax. No acute bony abnormality identified. IMPRESSION: Shallow inspiration radiograph with mild right perihilar atelectasis. No appreciable airspace consolidation or pulmonary edema. Electronically Signed   By: Jackey Loge DO   On: 03/12/2020 20:37   CT ABDOMEN PELVIS W CONTRAST  Result Date: 03/13/2020 CLINICAL DATA:  Abdominal pain, prior gastric bypass, October 2020 EXAM: CT ABDOMEN AND PELVIS WITH CONTRAST TECHNIQUE: Multidetector CT imaging of the abdomen and pelvis was performed using the standard protocol following bolus administration of intravenous contrast. CONTRAST:  OMNIPAQUE IOHEXOL 300 MG/ML  SOLN COMPARISON:  None. FINDINGS: Lower chest: The visualized heart size within normal limits. No pericardial fluid/thickening. No hiatal hernia. The visualized portions of the lungs are clear. Hepatobiliary: Mild diffuse low density  seen throughout the liver parenchyma. There is a hypodense lesion measuring 2.4 cm adjacent to the falciform ligament which appears to partially fill in on delayed images, likely hemangioma.The main portal vein is patent. Layering hyperdense sludge or small stones is seen. Pancreas: Unremarkable. No pancreatic ductal dilatation or surrounding inflammatory changes. Spleen: Normal in size without focal abnormality. Adrenals/Urinary Tract: Both adrenal glands appear normal. Multiple bilateral renal calculi are noted. The largest within the lower pole of the right kidney measures 4 mm and the largest within the upper pole the left kidney measuring 3 mm. No hydronephrosis. No collecting system calculi are seen. Bladder is unremarkable. Stomach/Bowel: The patient is status post Roux-en-Y gastric bypass. Adjacent to the distal jejunal jejunal anastomosis in the right lower quadrant there appears to be diffuse wall thickening and surrounding fat stranding changes. There is a multilocular peripherally enhancing fluid collection measuring 5.7 x 4.2 x 5.0 cm. The remainder of the small bowel is normal in appearance. There is a moderate amount of colonic stool however the colon is otherwise unremarkable. Vascular/Lymphatic: There are no enlarged mesenteric, retroperitoneal, or pelvic lymph nodes. Scattered mild atherosclerosis seen at the aorta bi-iliac bifurcation. Reproductive: There is a partially calcified hyperdense probable uterine fibroid seen in the posterior left fundus. A trace amount of free fluid seen within the cul-de-sac. Other: No evidence of abdominal wall mass or hernia. Mild diffuse anasarca is noted. Musculoskeletal: No acute or significant osseous findings. IMPRESSION: Findings consistent with enteritis involving the distal jejunal jejunal anastomosis with an adjacent multilocular collection, likely intra-abdominal abscess measuring 5.7 x 4.2 x 5.0 cm. Nonobstructing bilateral renal calculi Aortic  Atherosclerosis (ICD10-I70.0). Electronically Signed   By: Jonna Clark M.D.   On: 03/13/2020 00:57     ____________________________________________   PROCEDURES  Procedure(s) performed:yes .1-3 Lead EKG Interpretation Performed by: Nita Sickle, MD Authorized by: Nita Sickle, MD     Interpretation: normal     ECG rate assessment: normal     Rhythm: sinus rhythm     Ectopy: none     Critical Care performed: yes  CRITICAL CARE Performed by: Nita Sickle  ?  Total critical care time: 40 min  Critical care time was exclusive of separately billable procedures and treating other patients.  Critical care was necessary to treat or prevent imminent or life-threatening deterioration.  Critical care was time spent personally by me on the following activities: development of treatment plan with patient and/or surrogate as well as nursing, discussions with consultants, evaluation of patient's response to treatment, examination of patient, obtaining history from patient or surrogate, ordering and performing treatments and interventions, ordering  and review of laboratory studies, ordering and review of radiographic studies, pulse oximetry and re-evaluation of patient's condition.  ____________________________________________   INITIAL IMPRESSION / ASSESSMENT AND PLAN / ED COURSE  49 y.o. female with a history of diabetes, hypertension, hypothyroidism who presents for evaluation of diffuse abdominal pain x 1 week and now fever.  Patient has a temp of 101F and slightly tachycardic with a pulse of 104.  Abdomen is diffusely tender to palpation but worse in the epigastric and localize guarding.  CT is concerning for a fluid collection most likely an abscess measuring 5.7 x 4.2 x 5.0 cm located at the anastomosis of patient's prior bariatric surgery site. Patient underwent biliopancreatic diversion [facility] in October 2020.  Labs show normal white  count and negative lactic acidosis.  Will cover patient broadly with IV Zosyn, will give IV fentanyl for pain, IV Zofran for nausea.  We will send cultures.  Will contact [facility] for transfer.  Old medical records reviewed.    _________________________ 4:02 AM on 03/13/2020 -----------------------------------------  Patient was discussed with her surgeon at [facility] Dr. Smitty CordsBruce who recommended contacting [facility] since [facility] has no bed available.  I spoke with Dr. Curlene DolphinBermudez from bariatric surgery at [facility] who also does not have a bed available at this time.  Patient has been waitlisted on both facilities.  [facility] also does not have any beds available at this time.  I discussed patient with our in-house surgeon, Dr. Lady Garyannon, who recommended transferring patient as bariatric patient especially with complications are not managed here by her group.  She also recommended consulting interventional radiology to see if may be draining the abscess while patient awaits transfer can be a possibility.  IR has been paged.  In the meantime patient is receiving fluids, pain medication, and antibiotics.  _________________________ 4:22 AM on 03/13/2020 -----------------------------------------  I discussed the case with Dr. Grace IsaacWatts from interventional radiology who looked at the images and believes that IR would be able to drain the abscess to try to contain the infection while patient awaits for transfer.  He will plan on having radiologist here to do the procedure sometime this morning.  Discussed this with patient.  _________________________ 7:21 AM on 03/13/2020 -----------------------------------------  Patient remains stable. Awaiting for IR drainage. Care transferred to Dr. Fanny BienQuale.  _____________________________________________ Please note:  Patient was evaluated in Emergency Department today for the symptoms described in the history of present illness. Patient was evaluated in the context of the global  COVID-19 pandemic, which necessitated consideration that the patient might be at risk for infection with the SARS-CoV-2 virus that causes COVID-19. Institutional protocols and algorithms that pertain to the evaluation of patients at risk for COVID-19 are in a state of rapid change based on information released by regulatory bodies including the CDC and federal and state organizations. These policies and algorithms were followed during the patient's care in the ED.  Some ED evaluations and interventions may be delayed as a result of limited staffing during the pandemic.   [place] Controlled Substance Database was reviewed by me. ____________________________________________   FINAL CLINICAL IMPRESSION(S) / ED DIAGNOSES   Final diagnoses:  Postoperative intra-abdominal abscess  Abscess      NEW MEDICATIONS STARTED DURING THIS VISIT:  ED Discharge Orders    None       Note:  This document was prepared using Dragon voice recognition software and may include unintentional dictation errors.    Don PerkingVeronese, [place], MD 03/13/20 [phone]

## 2020-03-13 NOTE — ED Notes (Signed)
Tammy Costa (patient placement) at [facility] to page bariatric surgery on call

## 2020-03-13 NOTE — ED Notes (Signed)
Pt ambulatory to the bathroom at this time.

## 2020-03-13 NOTE — Progress Notes (Signed)
Recovery started in CTand continued in ED. Bedisde report given to [employer]

## 2020-03-18 LAB — CULTURE, BLOOD (ROUTINE X 2)
Culture: NO GROWTH
Culture: NO GROWTH
Special Requests: ADEQUATE
Special Requests: ADEQUATE

## 2020-03-18 LAB — AEROBIC/ANAEROBIC CULTURE W GRAM STAIN (SURGICAL/DEEP WOUND): Special Requests: NORMAL

## 2020-06-24 ENCOUNTER — Other Ambulatory Visit: Payer: Self-pay | Admitting: Family Medicine

## 2020-06-24 DIAGNOSIS — R928 Other abnormal and inconclusive findings on diagnostic imaging of breast: Secondary | ICD-10-CM

## 2020-07-13 ENCOUNTER — Ambulatory Visit: Payer: BC Managed Care – PPO

## 2020-08-10 ENCOUNTER — Other Ambulatory Visit: Payer: Self-pay

## 2020-08-10 ENCOUNTER — Ambulatory Visit
Admission: RE | Admit: 2020-08-10 | Discharge: 2020-08-10 | Disposition: A | Discharge status: Home / Self Care | Payer: BC Managed Care – PPO | Source: Ambulatory Visit | Attending: Family Medicine | Admitting: Family Medicine

## 2020-08-10 DIAGNOSIS — R928 Other abnormal and inconclusive findings on diagnostic imaging of breast: Secondary | ICD-10-CM | POA: Diagnosis not present

## 2020-08-18 ENCOUNTER — Other Ambulatory Visit: Payer: Self-pay | Admitting: Family Medicine

## 2020-08-18 DIAGNOSIS — R921 Mammographic calcification found on diagnostic imaging of breast: Secondary | ICD-10-CM

## 2020-08-18 DIAGNOSIS — Z1231 Encounter for screening mammogram for malignant neoplasm of breast: Secondary | ICD-10-CM

## 2020-08-18 DIAGNOSIS — R928 Other abnormal and inconclusive findings on diagnostic imaging of breast: Secondary | ICD-10-CM

## 2020-10-20 IMAGING — CR DG CHEST 2V
1 series · 2 of 2 positions shown · non-contrast
Comparison: Report from prior chest radiographs 04/05/2016 (images
unavailable), chest radiographs 07/06/2010.

CLINICAL DATA: Intermittent shortness of breath and rib pain.
Additional history provided: Left-sided abdominal pain radiating to
back.

EXAM:
CHEST - 2 VIEW

[Series 1: dg chest 2 view · 0.14mm/px · 2 of 2 slices shown]
[im 1/2]
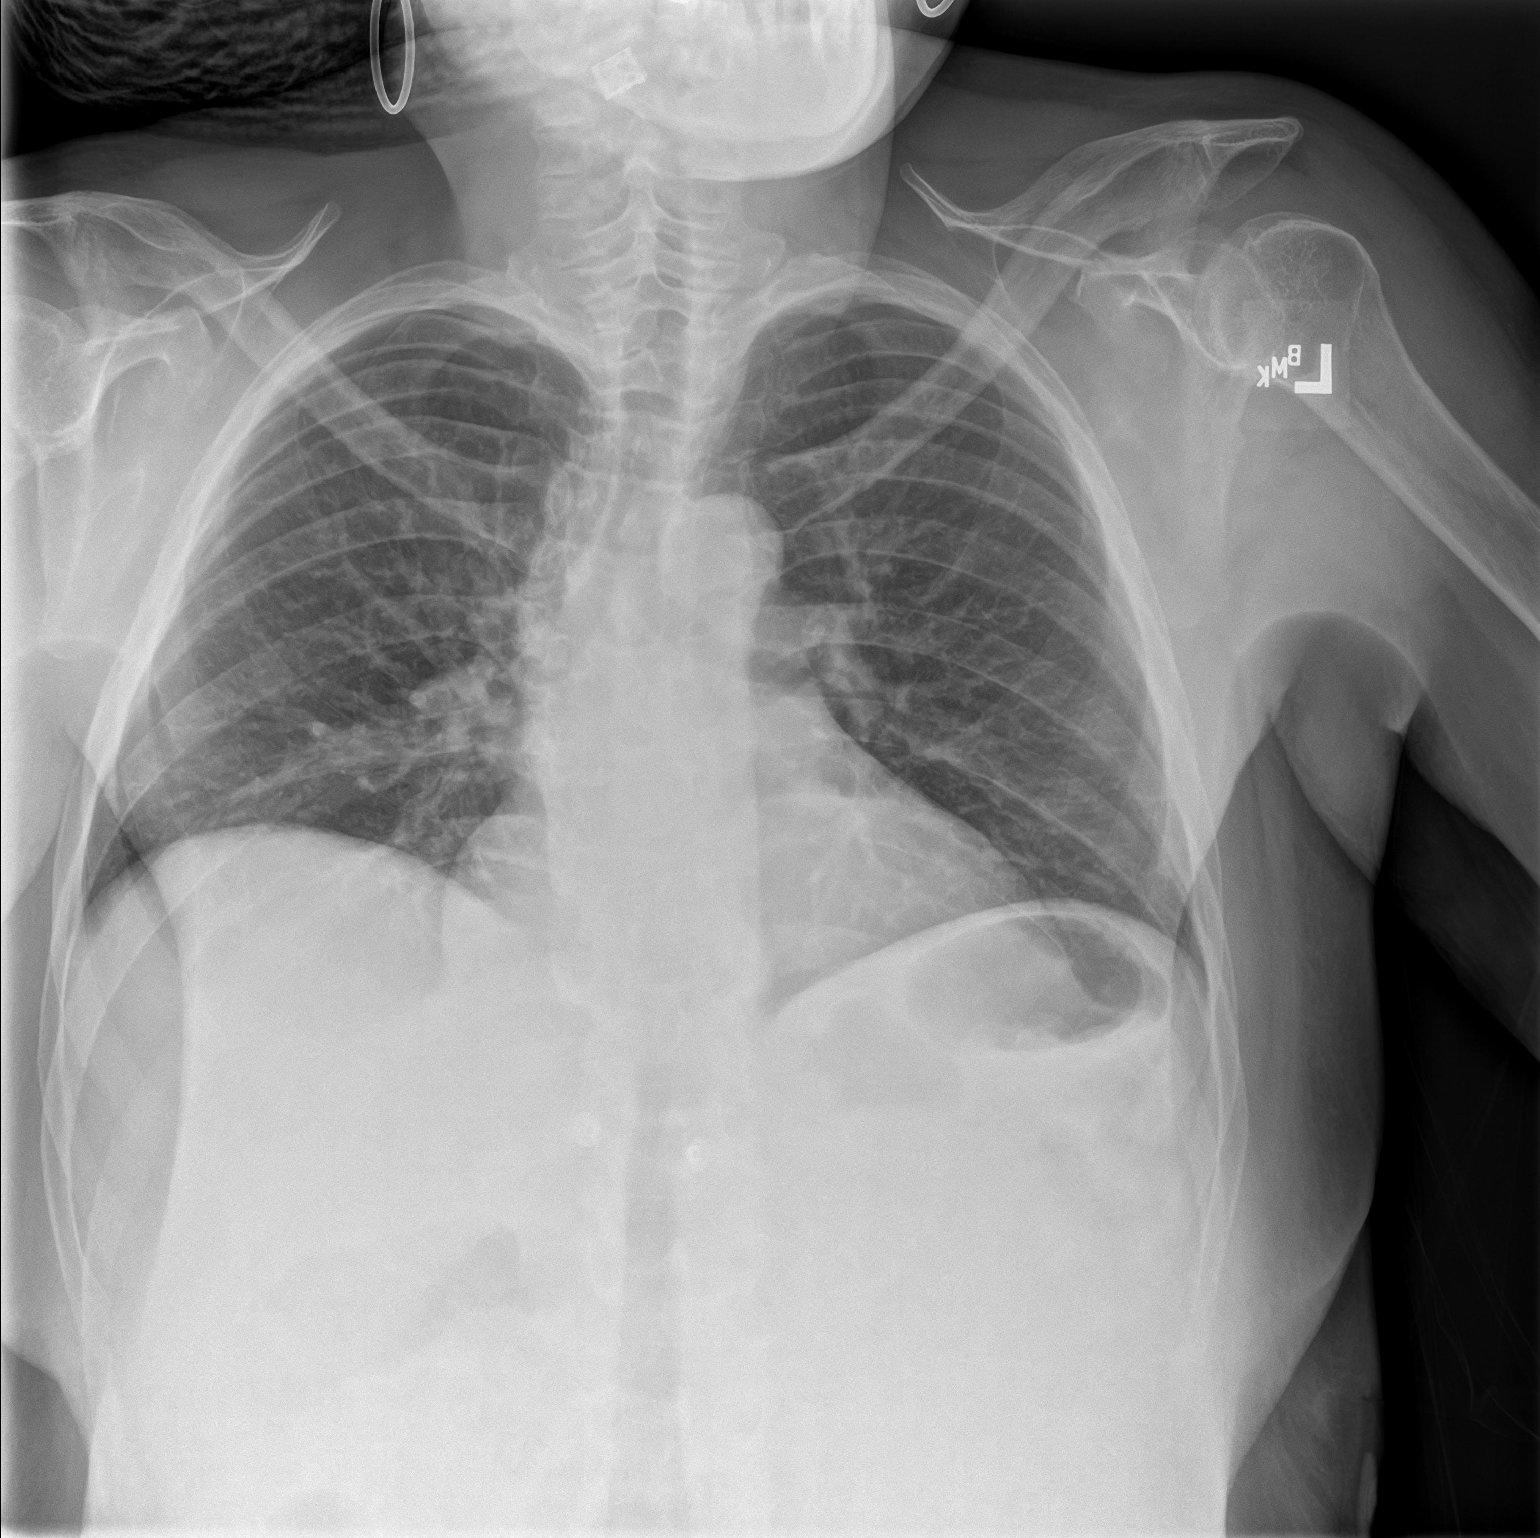
[im 2/2]
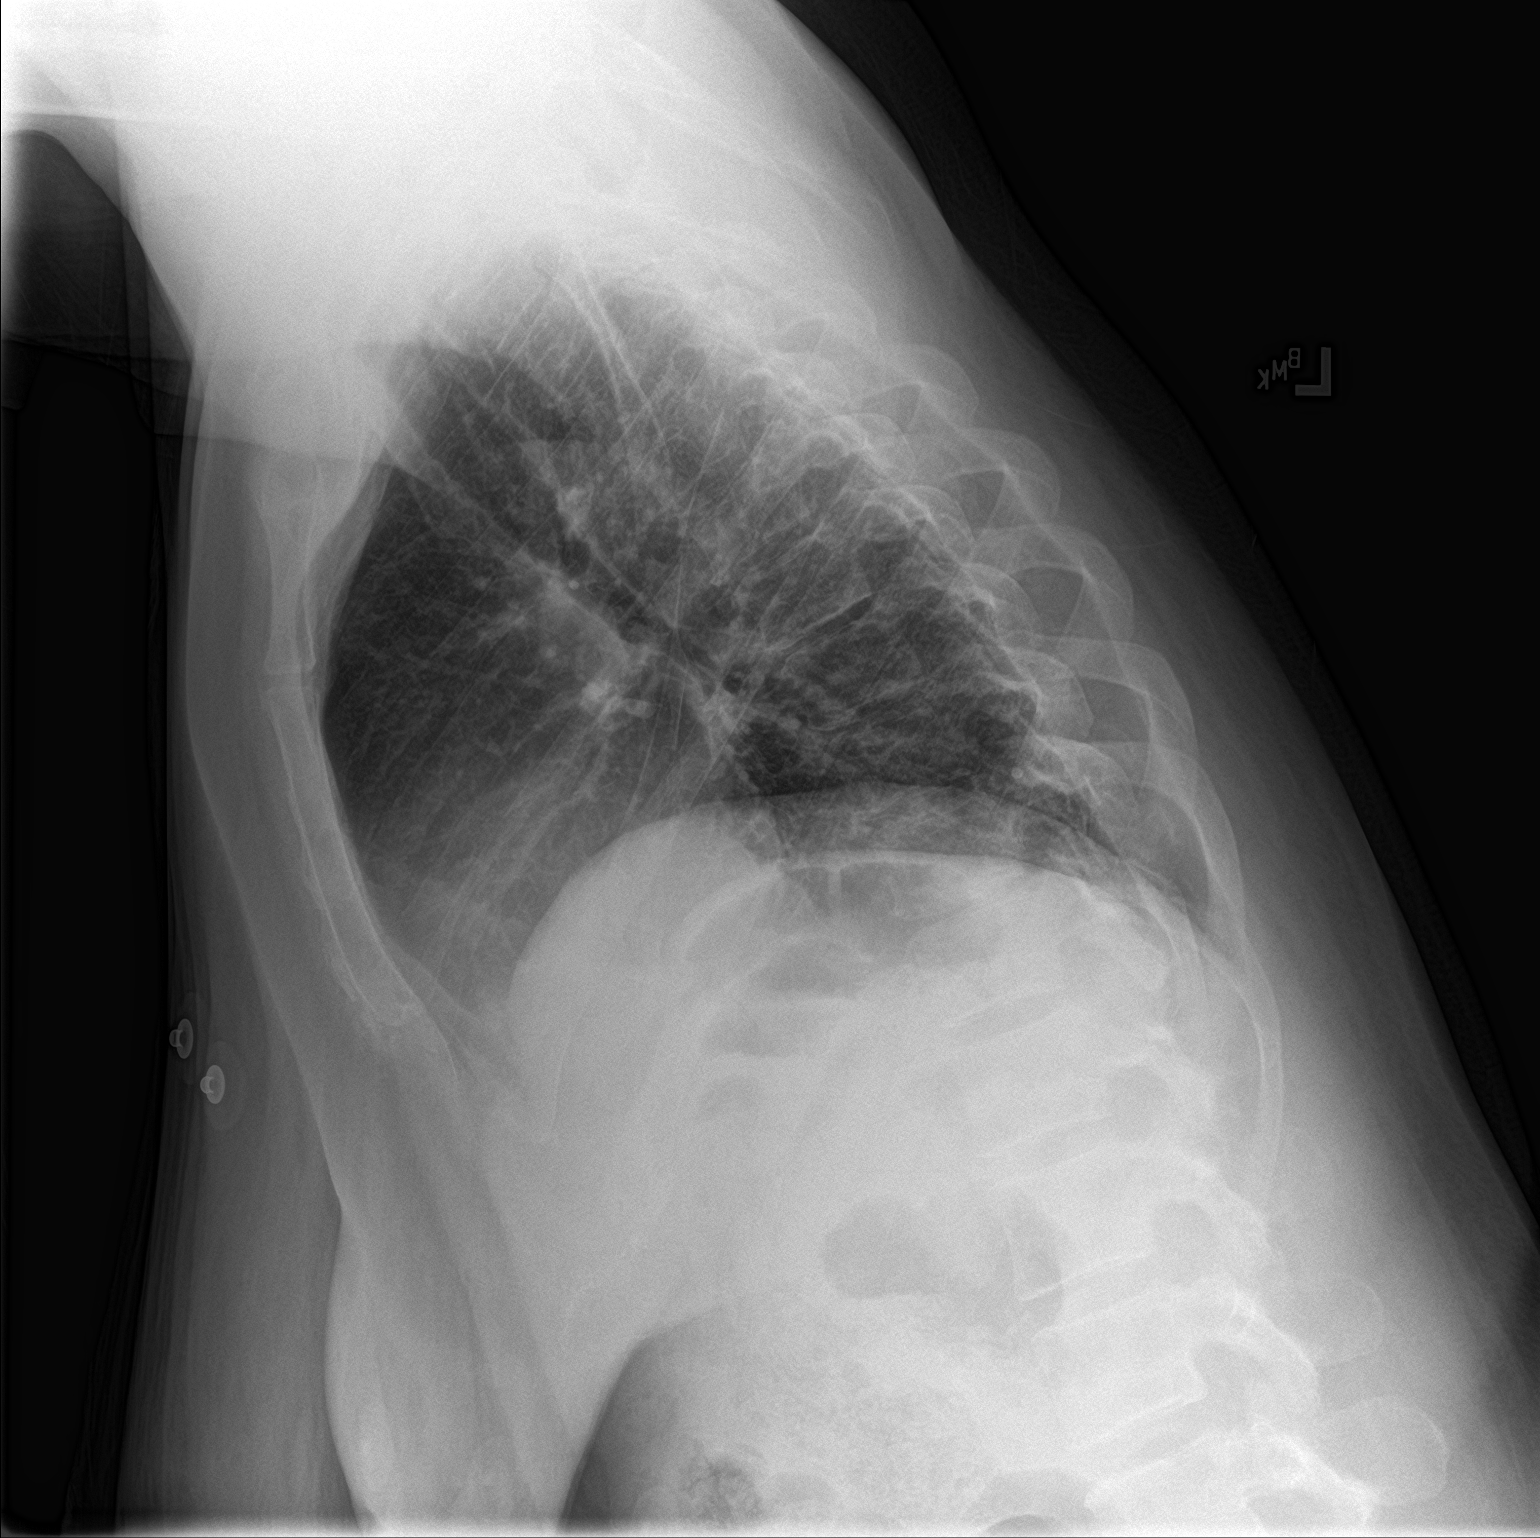

[2 of 2 positions shown; findings below may reference images not displayed]

FINDINGS: The cardiac silhouette is accentuated on this shallow inspiration
radiograph. Heart size is likely within normal limits. There is mild
right perihilar atelectasis. The left lung is clear. No evidence of
pleural effusion or pneumothorax. No acute bony abnormality
identified.
IMPRESSION: Shallow inspiration radiograph with mild right perihilar
atelectasis.

No appreciable airspace consolidation or pulmonary edema.

## 2020-10-21 IMAGING — CT CT IMAGE GUIDED DRAINAGE BY PERCUTANEOUS CATHETER
1 of 2 series · 13 of 32 positions shown, 18 images · non-contrast
Comparison: none

INDICATION: 49-year-old female with a history of prior gastric bypass surgery
performed at an outside institution. She has progressive abdominal
pain and pressure. CT imaging demonstrates a fluid collection within
the anterior abdomen. She presents for CT-guided drain placement.

[Series 2: i-spiral 5.0 b30f · axial · 0.86mm/px · z∈[-230,-132]mm · 13 of 32 slices shown, 18 images]
[im 2/32  soft-tissue]
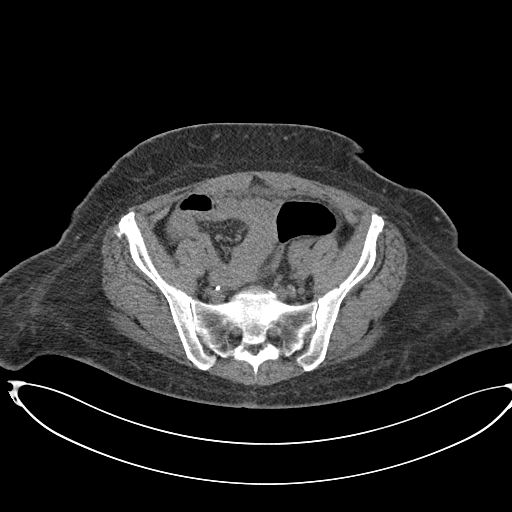
[im 2/32  bone]
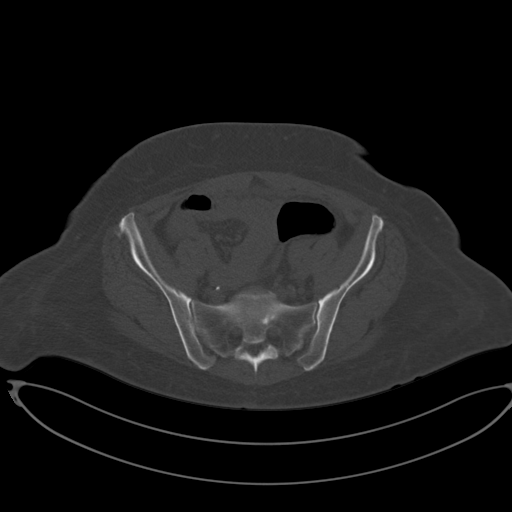
[im 6/32  soft-tissue]
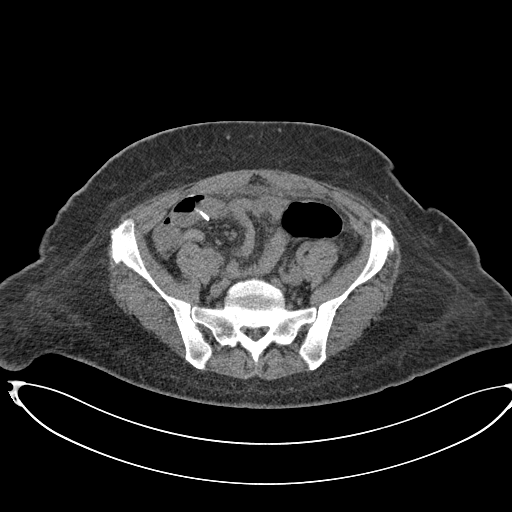
[im 7/32  soft-tissue]
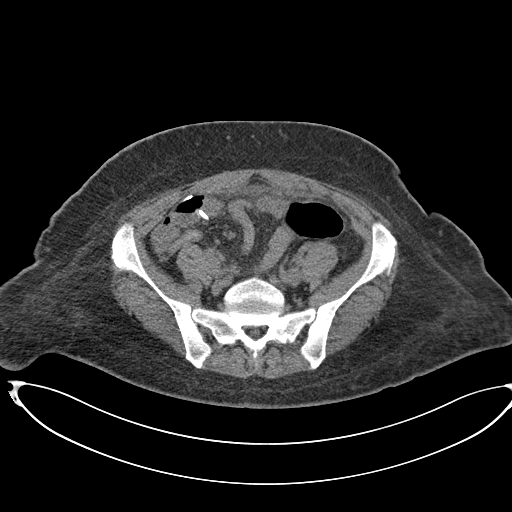
[im 9/32  soft-tissue]
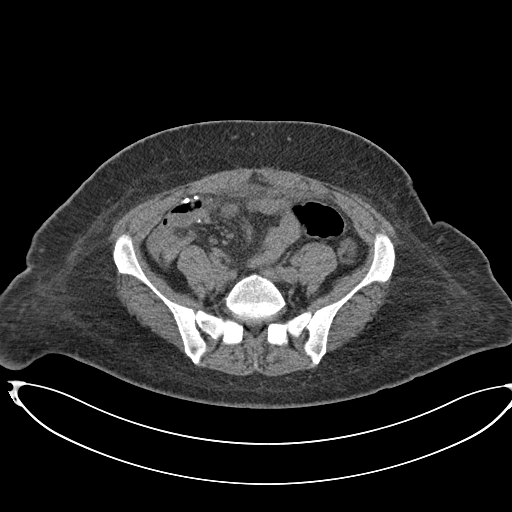
[im 13/32  soft-tissue]
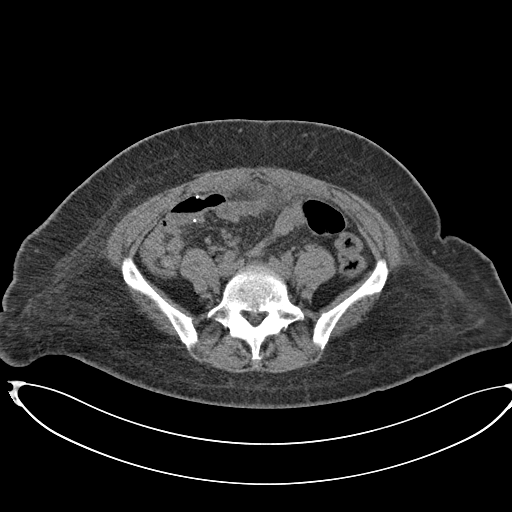
[im 14/32  soft-tissue]
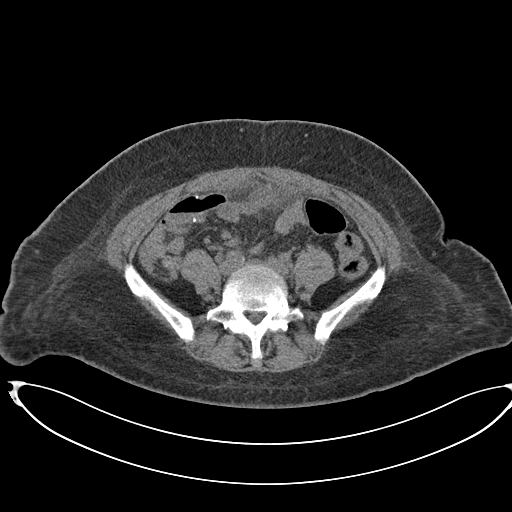
[im 18/32  soft-tissue]
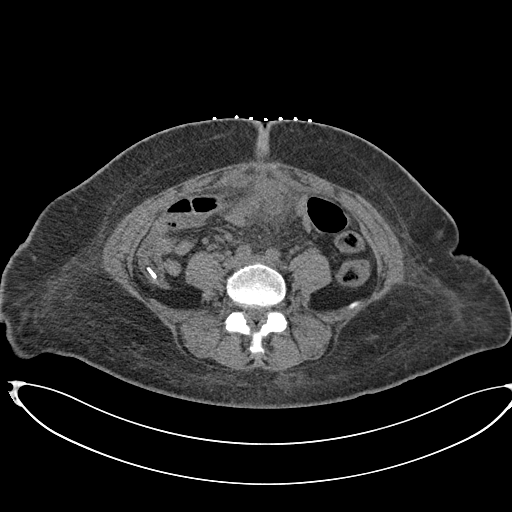
[im 19/32  soft-tissue]
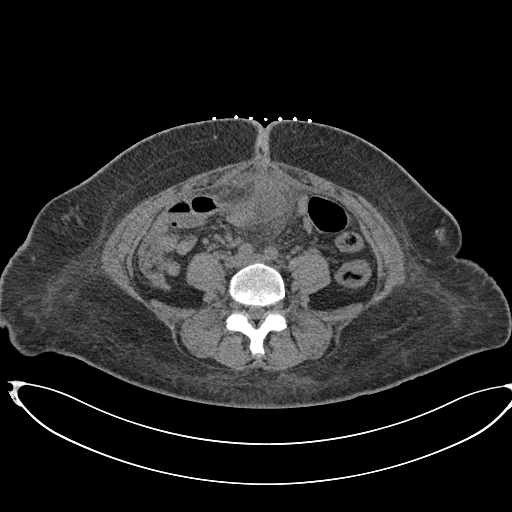
[im 23/32  soft-tissue]
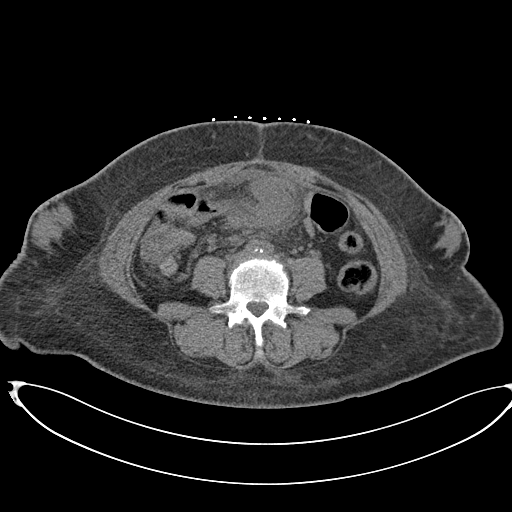
[im 23/32  bone]
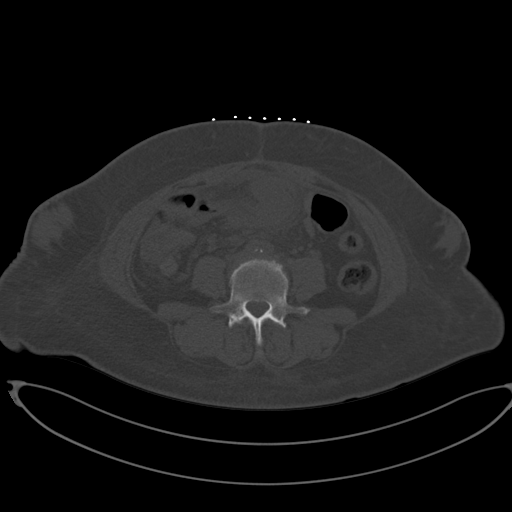
[im 25/32  soft-tissue]
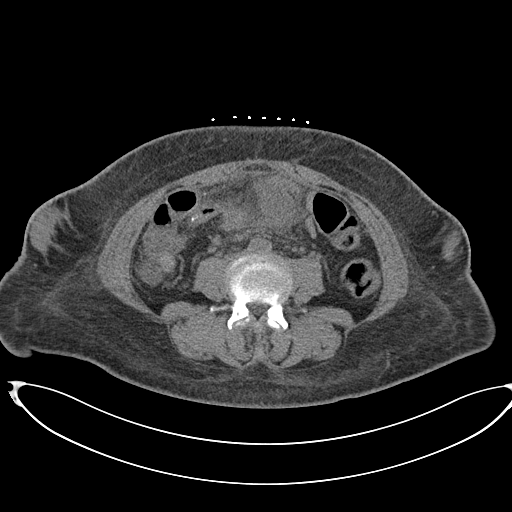
[im 25/32  lung]
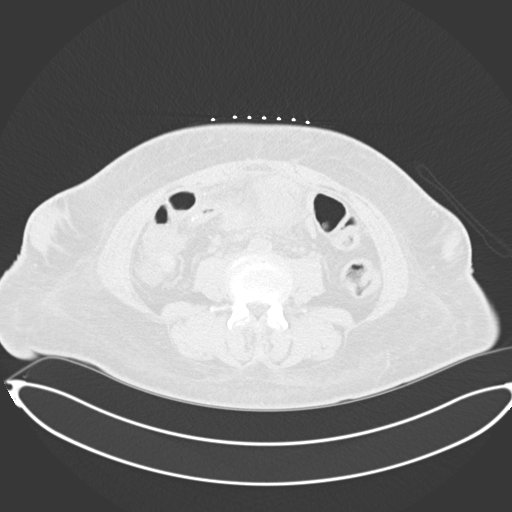
[im 26/32  soft-tissue]
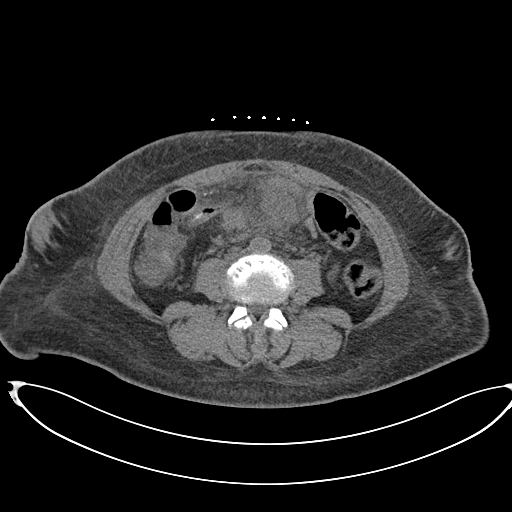
[im 26/32  lung]
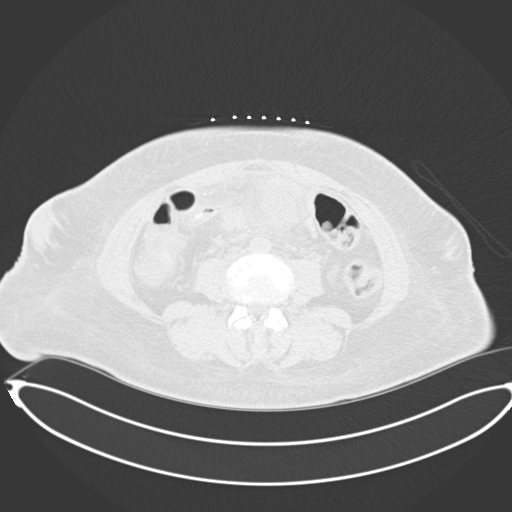
[im 28/32  lung]
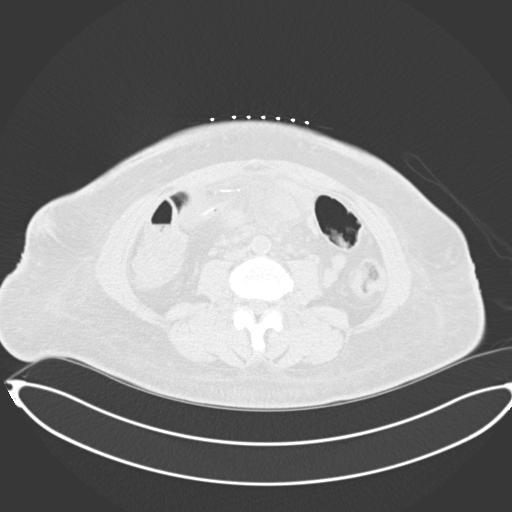
[im 30/32  soft-tissue]
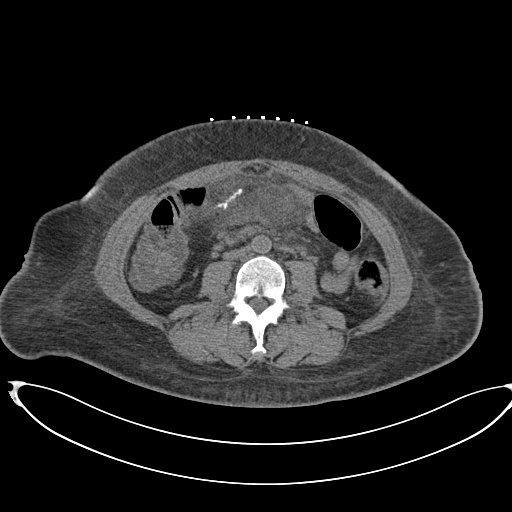
[im 30/32  lung]
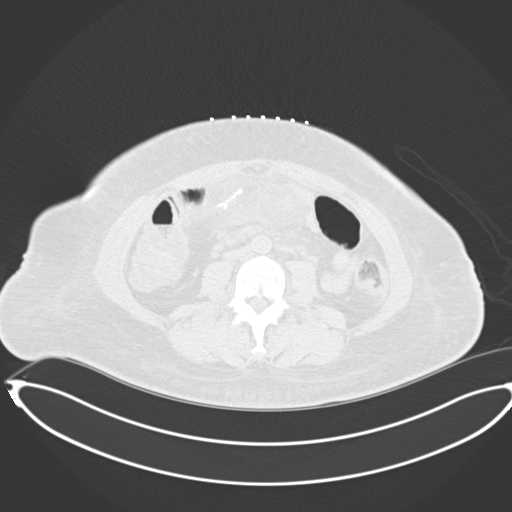

[13 of 32 positions shown; findings below may reference images not displayed]

EXAM:
CT-guided drain placement

MEDICATIONS:
The patient is currently admitted to the hospital and receiving
intravenous antibiotics. The antibiotics were administered within an
appropriate time frame prior to the initiation of the procedure.

ANESTHESIA/SEDATION:
Fentanyl 200 mcg IV; Versed 4 mg IV

Moderate Sedation Time:  30 minutes

The patient was continuously monitored during the procedure by the
interventional radiology nurse under my direct supervision.

COMPLICATIONS:
None immediate.

PROCEDURE:
Informed written consent was obtained from the patient after a
thorough discussion of the procedural risks, benefits and
alternatives. All questions were addressed. Maximal Sterile Barrier
Technique was utilized including caps, mask, sterile gowns, sterile
gloves, sterile drape, hand hygiene and skin antiseptic. A timeout
was performed prior to the initiation of the procedure.

A planning axial CT scan was performed. The fluid collection was
identified. A suitable skin entry site was selected and marked.
Local anesthesia was attained by infiltration with 1% lidocaine
after sterile prep and drape utilizing chlorhexidine skin prep. A
small dermatotomy was made. Under intermittent CT guidance, an 18
gauge trocar needle was advanced through the abdominal wall and into
the fluid collection. A 0.035 wire was then coiled in the fluid
collection.

The skin tract was dilated to 12 French. [REDACTED] French
all-purpose drainage catheter was advanced over the wire and formed.
Aspiration yields approximately 20-25 mL of thick purulent fluid.
The catheter was gently flushed and secured to the skin with 0
Prolene suture before being connected to a JP bulb suction.

Bandages were applied.  The patient tolerated the procedure well.
IMPRESSION: Placement of a 12 French percutaneous drainage catheter with
aspiration of 20-25 mL frankly purulent fluid. Samples were sent for
Gram stain and culture.

PLAN:
1. Maintain drain to JP bulb suction.
2. Flush drainage catheter at least once per shift with 5 mL saline
3. Repeat CT scan of the abdomen and pelvis with intravenous
contrast when drain output is scant (10 mL or less for at least 48
hours).
4.

## 2021-01-27 ENCOUNTER — Ambulatory Visit
Admission: RE | Admit: 2021-01-27 | Discharge: 2021-01-27 | Disposition: A | Discharge status: Home / Self Care | Payer: BC Managed Care – PPO | Source: Ambulatory Visit | Attending: Family Medicine | Admitting: Family Medicine

## 2021-01-27 ENCOUNTER — Other Ambulatory Visit: Payer: Self-pay

## 2021-01-27 DIAGNOSIS — Z1231 Encounter for screening mammogram for malignant neoplasm of breast: Secondary | ICD-10-CM | POA: Insufficient documentation

## 2021-01-27 DIAGNOSIS — R921 Mammographic calcification found on diagnostic imaging of breast: Secondary | ICD-10-CM | POA: Diagnosis present

## 2021-01-27 DIAGNOSIS — R928 Other abnormal and inconclusive findings on diagnostic imaging of breast: Secondary | ICD-10-CM | POA: Insufficient documentation

## 2021-07-31 ENCOUNTER — Encounter: Payer: Self-pay | Admitting: Emergency Medicine

## 2021-07-31 ENCOUNTER — Emergency Department: Payer: BC Managed Care – PPO

## 2021-07-31 ENCOUNTER — Other Ambulatory Visit: Payer: Self-pay

## 2021-07-31 ENCOUNTER — Emergency Department
Admission: EM | Admit: 2021-07-31 | Discharge: 2021-07-31 | Disposition: A | Discharge status: Home / Self Care | Payer: BC Managed Care – PPO | Attending: Emergency Medicine | Admitting: Emergency Medicine

## 2021-07-31 DIAGNOSIS — W500XXA Accidental hit or strike by another person, initial encounter: Secondary | ICD-10-CM | POA: Diagnosis not present

## 2021-07-31 DIAGNOSIS — S52572A Other intraarticular fracture of lower end of left radius, initial encounter for closed fracture: Secondary | ICD-10-CM | POA: Insufficient documentation

## 2021-07-31 DIAGNOSIS — Z794 Long term (current) use of insulin: Secondary | ICD-10-CM | POA: Diagnosis not present

## 2021-07-31 DIAGNOSIS — S52615A Nondisplaced fracture of left ulna styloid process, initial encounter for closed fracture: Secondary | ICD-10-CM | POA: Diagnosis not present

## 2021-07-31 DIAGNOSIS — I10 Essential (primary) hypertension: Secondary | ICD-10-CM | POA: Insufficient documentation

## 2021-07-31 DIAGNOSIS — E119 Type 2 diabetes mellitus without complications: Secondary | ICD-10-CM | POA: Insufficient documentation

## 2021-07-31 DIAGNOSIS — F17210 Nicotine dependence, cigarettes, uncomplicated: Secondary | ICD-10-CM | POA: Diagnosis not present

## 2021-07-31 DIAGNOSIS — Z79899 Other long term (current) drug therapy: Secondary | ICD-10-CM | POA: Diagnosis not present

## 2021-07-31 DIAGNOSIS — S6992XA Unspecified injury of left wrist, hand and finger(s), initial encounter: Secondary | ICD-10-CM | POA: Diagnosis present

## 2021-07-31 DIAGNOSIS — M25532 Pain in left wrist: Secondary | ICD-10-CM | POA: Diagnosis not present

## 2021-07-31 MED ORDER — NAPROXEN 500 MG PO TABS: 500.0000 mg | ORAL_TABLET | Freq: Two times a day (BID) | ORAL | 0 refills | Status: AC

## 2021-07-31 MED ORDER — IBUPROFEN 400 MG PO TABS
400.0000 mg | ORAL_TABLET | Freq: Once | ORAL | Status: AC
  Administered 2021-07-31: 10:00:00 400 mg via ORAL
  Filled 2021-07-31: qty 1
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
## 2021-07-31 NOTE — Discharge Instructions (Addendum)
Please call the orthopedic doctors on Monday to schedule follow-up appointment.
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
## 2021-08-01 ENCOUNTER — Telehealth: Payer: Self-pay | Admitting: Emergency Medicine

## 2021-08-01 MED ORDER — OXYCODONE HCL 5 MG PO TABS
5.0000 mg | ORAL_TABLET | Freq: Three times a day (TID) | ORAL | 0 refills | Status: AC | PRN
Start: 2021-08-01 — End: 2021-08-04

## 2021-08-01 NOTE — Telephone Encounter (Signed)
Patient seen by myself yesterday for wrist fracture.  Calling today because pain is not relieved with naproxen.  Will prescribe a short course of oxycodone.  She has orthopedic follow-up tomorrow.

## 2022-01-06 ENCOUNTER — Other Ambulatory Visit: Payer: Self-pay | Admitting: Family Medicine

## 2022-01-06 DIAGNOSIS — Z1231 Encounter for screening mammogram for malignant neoplasm of breast: Secondary | ICD-10-CM

## 2022-01-19 ENCOUNTER — Other Ambulatory Visit: Payer: Self-pay | Admitting: Family Medicine

## 2022-01-19 DIAGNOSIS — R921 Mammographic calcification found on diagnostic imaging of breast: Secondary | ICD-10-CM

## 2022-01-19 DIAGNOSIS — Z1231 Encounter for screening mammogram for malignant neoplasm of breast: Secondary | ICD-10-CM

## 2022-02-03 ENCOUNTER — Ambulatory Visit
Admission: RE | Admit: 2022-02-03 | Discharge: 2022-02-03 | Disposition: A | Discharge status: Home / Self Care | Payer: BC Managed Care – PPO | Source: Ambulatory Visit | Attending: Family Medicine | Admitting: Family Medicine

## 2022-02-03 DIAGNOSIS — R921 Mammographic calcification found on diagnostic imaging of breast: Secondary | ICD-10-CM | POA: Insufficient documentation

## 2022-02-03 DIAGNOSIS — Z1231 Encounter for screening mammogram for malignant neoplasm of breast: Secondary | ICD-10-CM | POA: Diagnosis present

## 2022-04-12 ENCOUNTER — Other Ambulatory Visit: Payer: Self-pay | Admitting: Family Medicine

## 2022-04-12 DIAGNOSIS — E559 Vitamin D deficiency, unspecified: Secondary | ICD-10-CM

## 2022-04-12 DIAGNOSIS — Z13820 Encounter for screening for osteoporosis: Secondary | ICD-10-CM

## 2022-05-24 ENCOUNTER — Ambulatory Visit
Admission: RE | Admit: 2022-05-24 | Discharge: 2022-05-24 | Disposition: A | Discharge status: Home / Self Care | Payer: BC Managed Care – PPO | Source: Ambulatory Visit | Attending: Family Medicine | Admitting: Family Medicine

## 2022-05-24 DIAGNOSIS — Z13820 Encounter for screening for osteoporosis: Secondary | ICD-10-CM | POA: Diagnosis present

## 2022-05-24 DIAGNOSIS — E559 Vitamin D deficiency, unspecified: Secondary | ICD-10-CM | POA: Insufficient documentation

## 2022-06-26 ENCOUNTER — Other Ambulatory Visit: Payer: Self-pay | Admitting: Family Medicine

## 2022-06-26 ENCOUNTER — Ambulatory Visit
Admission: RE | Admit: 2022-06-26 | Discharge: 2022-06-26 | Disposition: A | Discharge status: Home / Self Care | Payer: BC Managed Care – PPO | Source: Ambulatory Visit | Attending: Family Medicine | Admitting: Family Medicine

## 2022-06-26 DIAGNOSIS — R042 Hemoptysis: Secondary | ICD-10-CM | POA: Insufficient documentation

## 2022-10-19 ENCOUNTER — Encounter: Payer: Self-pay | Admitting: Internal Medicine

## 2022-10-19 ENCOUNTER — Ambulatory Visit: Payer: Managed Care, Other (non HMO) | Admitting: Internal Medicine

## 2022-10-19 VITALS — BP 126/82 | HR 80 | Ht 65.0 in | Wt 126.0 lb

## 2022-10-19 DIAGNOSIS — E1142 Type 2 diabetes mellitus with diabetic polyneuropathy: Secondary | ICD-10-CM | POA: Diagnosis not present

## 2022-10-19 DIAGNOSIS — Z9884 Bariatric surgery status: Secondary | ICD-10-CM | POA: Diagnosis not present

## 2022-10-19 DIAGNOSIS — Z794 Long term (current) use of insulin: Secondary | ICD-10-CM | POA: Diagnosis not present

## 2022-10-19 MED ORDER — TRADJENTA 5 MG PO TABS: 5.0000 mg | ORAL_TABLET | Freq: Every day | ORAL | 3 refills | Status: DC

## 2022-10-19 MED ORDER — INSULIN LISPRO (1 UNIT DIAL) 100 UNIT/ML (KWIKPEN): PEN_INJECTOR | SUBCUTANEOUS | 2 refills | Status: AC

## 2022-10-19 MED ORDER — PIOGLITAZONE HCL 15 MG PO TABS: 15.0000 mg | ORAL_TABLET | Freq: Every day | ORAL | 3 refills | Status: DC

## 2022-10-19 MED ORDER — DEXCOM G7 SENSOR MISC: 1.0000 | 3 refills | Status: DC

## 2022-10-19 MED ORDER — INSULIN PEN NEEDLE 32G X 4 MM MISC: 1.0000 | Freq: Three times a day (TID) | 3 refills | Status: DC

## 2022-10-19 NOTE — Patient Instructions (Signed)
Continue Tradjenta 5 mg daily  Start Pioglitazone 15 mg daily   Humalog correctional insulin:Use the scale below to help guide you before each meal   Blood sugar before meal Number of units to inject  Less than 175 0 unit  176 -  220 1 units  221 -  265 2 units  266 -  310 3 units  311 -  355 4 units  356 -  400 5 units  401 -  445 6 units  446 -  490 7 units    HOW TO TREAT LOW BLOOD SUGARS (Blood sugar LESS THAN 70 MG/DL) Please follow the RULE OF 15 for the treatment of hypoglycemia treatment (when your (blood sugars are less than 70 mg/dL)   STEP 1: Take 15 grams of carbohydrates when your blood sugar is low, which includes:  3-4 GLUCOSE TABS  OR 3-4 OZ OF JUICE OR REGULAR SODA OR ONE TUBE OF GLUCOSE GEL    STEP 2: RECHECK blood sugar in 15 MINUTES STEP 3: If your blood sugar is still low at the 15 minute recheck --> then, go back to STEP 1 and treat AGAIN with another 15 grams of carbohydrates.

## 2022-10-19 NOTE — Progress Notes (Signed)
Name: CIMONE KOLANO  MRN/ DOB: [id number], 1971-07-02   Age/ Sex: 52 y.o., female    PCP: [facility]   Reason for Endocrinology Evaluation: Type 2 Diabetes Mellitus     Date of Initial Endocrinology Visit: 10/19/2022     PATIENT IDENTIFIER: Ms. SYNETTA GOGUE is a 52 y.o. 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 The patient presented for initial endocrinology clinic visit on 10/19/2022 for consultative assistance with her diabetes management.    HPI: Ms. Keitz was    Diagnosed with DM in her 20's  Prior Medications tried/Intolerance: Lady Gary was added 03/2022 Currently checking blood sugars 3-4 x / day  Hypoglycemia episodes : yes               Symptoms: yes                 Frequency: 1-2 week Hemoglobin A1c unknown  In terms of diet, the patient eats constantly to gain weight ? Patient with chronic diarrhea and takes Creon as well as Pepto-Bismol Has occasional nausea or diarrhea  Has tiredness and weakness    Father and grandfather with DM  She has no hx of pancreatitis   Pre sx  weight , currently at 126 mg/dL   She had a reading of 26 mg/dl at 6 pm, work up around 1 AM it was > 441 mg/dL   Works nights   Pt with hx of RAI ablation for Graves' disease may years ago, she has been on LT-4 replacement.    HOME DIABETES REGIMEN: Tradjenta 5 mg daily Humalog 2- 20 units- she guesstimates      Statin: no ACE-I/ARB: yes   METER DOWNLOAD SUMMARY: Date range evaluated: 2/8 - 10/19/2022 Fingerstick Blood Glucose Tests = 150 Average Number Tests/Day = 5.2 Overall Mean FS Glucose = 180 Standard Deviation = 122  BG Ranges: Low = 25 High = 557   Hypoglycemic Events/30 Days: BG < 50 = 7 Episodes of symptomatic severe hypoglycemia = 3   DIABETIC COMPLICATIONS: Microvascular complications:  Neuropathy Denies: CKD , retinopathy Last eye exam:  Completed 09/2022  Macrovascular complications:   Denies: CAD, PVD, CVA   PAST HISTORY: Past Medical History:  Past Medical History:  Diagnosis Date   Diabetes mellitus without complication ([place])    Hypertension    Thyroid disease    Past Surgical History:  Past Surgical History:  Procedure Laterality Date   BREAST CYST ASPIRATION Left    neg   TUBAL LIGATION      Social History:  reports that she has been smoking cigarettes. She has never used smokeless tobacco. She reports current alcohol use. No history on file for drug use. Family History:  Family History  Problem Relation Age of Onset   Breast cancer Maternal Aunt      HOME MEDICATIONS: Allergies as of 10/19/2022       Reactions   Sulfa Antibiotics Rash, Itching, Swelling   Sulfasalazine Rash        Medication List        Accurate as of October 19, 2022  8:50 AM. If you have any questions, ask your nurse or doctor.          STOP taking these medications    Cholecalciferol 125 MCG (5000 UT) Tabs Stopped by: Dorita Sciara, MD   cyanocobalamin 1000 MCG tablet Stopped by: Dorita Sciara, MD  losartan 25 MG tablet Commonly known as: COZAAR Stopped by: Dorita Sciara, MD       TAKE these medications    alendronate 70 MG tablet Commonly known as: FOSAMAX Take 70 mg by mouth once a week.   amLODipine 5 MG tablet Commonly known as: NORVASC Take 5 mg by mouth daily.   azaTHIOprine 50 MG tablet Commonly known as: IMURAN Take 50 mg by mouth daily. Take 1 tablet morning and 2 tabs in evening   Creon 36000 UNITS Cpep capsule Generic drug: lipase/protease/amylase Take 2 capsules by mouth 3 (three) times daily with meals.   fluticasone 50 MCG/ACT nasal spray Commonly known as: FLONASE Place 2 sprays into both nostrils as needed.   hydroxychloroquine 200 MG tablet Commonly known as: PLAQUENIL Take 200 mg by mouth daily.   insulin lispro 100 UNIT/ML KwikPen Commonly known  as: HUMALOG Inject 8-20 Units into the skin in the morning and at bedtime.   levothyroxine 25 MCG tablet Commonly known as: SYNTHROID Take 25 mcg by mouth daily before breakfast. What changed: Another medication with the same name was removed. Continue taking this medication, and follow the directions you see here. Changed by: Dorita Sciara, MD   levothyroxine 200 MCG tablet Commonly known as: SYNTHROID Take 200 mcg by mouth daily before breakfast. What changed: Another medication with the same name was removed. Continue taking this medication, and follow the directions you see here. Changed by: Dorita Sciara, MD   Microlet Lancets Misc   Tradjenta 5 MG Tabs tablet Generic drug: linagliptin Take 5 mg by mouth daily.   Urogesic-Blue 81.6 MG Tabs Take 1 tablet (81.6 mg total) by mouth every 8 (eight) hours as needed.   Vitamin D (Ergocalciferol) 1.25 MG (50000 UNIT) Caps capsule Commonly known as: DRISDOL Take 50,000 Units by mouth once a week.         ALLERGIES: Allergies  Allergen Reactions   Sulfa Antibiotics Rash, Itching and Swelling   Sulfasalazine Rash     REVIEW OF SYSTEMS: A comprehensive ROS was conducted with the patient and is negative except as per HPI  OBJECTIVE:   VITAL SIGNS: BP 126/82 (BP Location: Left Arm, Patient Position: Sitting, Cuff Size: Small)   Pulse 80   Ht '5\' 5"'$  (1.651 m)   Wt 126 lb (57.2 kg)   SpO2 96%   BMI 20.97 kg/m    PHYSICAL EXAM:  General: Pt appears well and is in NAD  Neck: General: Supple without adenopathy or carotid bruits. Thyroid: Thyroid size normal.  No goiter or nodules appreciated.   Lungs: Clear with good BS bilat with no rales, rhonchi, or wheezes  Heart: RRR   Abdomen:  soft, nontender  Extremities:  Lower extremities - No pretibial edema.  Neuro: MS is good with appropriate affect, pt is alert and Ox3     DATA REVIEWED: 09/14/2022  GFR 102   ASSESSMENT / PLAN / RECOMMENDATIONS:    1) Type 2 Diabetes Mellitus, Sub-optimally controlled, With  complications -  Goal 123456 < 7.0 %.    Per patient she had an A1c 3 weeks ago, that was in the high sevens, she was unable to log into the portal, she signed ROI for release of that information Despite having a gastric bypass with weight loss, she has not been able to go in to remission from diabetes She has no history of pancreatitis and a question type I DM, we will obtain GAD-65 and islet cell antibodies She is  not interested in weight loss  and would like to avoid all weight loss medication  Will start Pioglitazone, cautioned against LE edema  Dexcom sent to pharmacy  Pt has been guesstimating on humalog dose with results in glycemic excrusions with BG as low as 26 mg/dL  Patient is s/p duodenal switch which exacerbates hypoglycemia We discussed the importance of checking glucose before the meals, using Humalog per correction scale, I have advised the patient to avoid guesstimating on her insulin doses as this has caused severe hypoglycemia which she understands this could be fatal Dexcom has been prescribed  MEDICATIONS: Continue Tradjenta 5 mg daily  Start Pioglitazone 15 mg daily  CF: Humalog (BG -130/45)   EDUCATION / INSTRUCTIONS: BG monitoring instructions: Patient is instructed to check her blood sugars 3 times a day, before meals. Call [facility] if: BG persistently < 70  I reviewed the Rule of 15 for the treatment of hypoglycemia in detail with the patient. Literature supplied.   2) Diabetic complications:  Eye: Does not have known diabetic retinopathy.  Neuro/ Feet: Does not have known diabetic peripheral neuropathy. Renal: Patient does not have known baseline CKD. She is not on an ACEI/ARB at present.   Follow-up in 3 months   Signed electronically by: Mack Guise, MD  [facility] End[facility]  [facility] [address] [place], [place]  [address] Phone: [phone] FAX: [phone]   CC: [facility], [facility] [place] [address] Phone: [phone]  Fax: [phone]    Return to Endocrinology clinic as below: No future appointments.

## 2022-10-24 ENCOUNTER — Telehealth: Payer: Self-pay | Admitting: Internal Medicine

## 2022-10-24 LAB — ANTI-ISLET CELL ANTIBODY: Islet Cell Ab: NEGATIVE

## 2022-10-24 LAB — GLUTAMIC ACID DECARBOXYLASE AUTO ABS: Glutamic Acid Decarb Ab: 1301.9 U/mL — ABNORMAL HIGH (ref 0.0–5.0)

## 2022-10-24 MED ORDER — DEXCOM G7 RECEIVER DEVI: 0 refills | Status: DC

## 2022-10-24 NOTE — Telephone Encounter (Signed)
Receiver sent to [facility]

## 2022-10-24 NOTE — Telephone Encounter (Signed)
Patient is calling to say that she received her Dexcom G7 CGM but could not get it set up.  Patient called [employer] to get help and they told her that she needed the [place] receiver in order for it to work.  [facility] also told her that the prescription could be sent in to her local [facility].  Patient uses [facility] [address] in [place].   [facility]-phone # is [phone].

## 2022-10-30 ENCOUNTER — Encounter: Payer: Self-pay | Admitting: Internal Medicine

## 2022-11-23 ENCOUNTER — Other Ambulatory Visit: Payer: Self-pay | Admitting: Family Medicine

## 2022-11-23 DIAGNOSIS — Z1231 Encounter for screening mammogram for malignant neoplasm of breast: Secondary | ICD-10-CM

## 2023-01-01 NOTE — Progress Notes (Unsigned)
Name: Tammy Costa  MRN/ DOB: [id number], May 22, 1971   Age/ Sex: 52 y.o., female    PCP: Center, [facility]   Reason for Endocrinology Evaluation: Type 1 Diabetes Mellitus     Date of Initial Endocrinology Visit: 10/19/2022    PATIENT IDENTIFIER: Ms. Tammy Costa is a 52 y.o. 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 The patient presented for initial endocrinology clinic visit on 10/19/2022 for consultative assistance with her diabetes management.    HPI: 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 We proceeded to check GAD 65 which was elevated at 1301.9 U/mL 10/2022  On her initial visit to our clinic we were unable to do an A1c at the office as she just had it done through her PCPs office, these records were not available even though the patient signed release of information forms    THYROID HISTORY   Pt with hx of RAI ablation for Graves' disease may years ago, she has been on LT-4 replacement.      SUBJECTIVE:   During the last visit (10/16/2022):     Today (01/02/23): Tammy Costa is here for follow-up on diabetes management.  She checks her blood sugars multiple times daily. The patient has  had hypoglycemic episodes since the last clinic visit, she is symptoms with these episodes.     Works 3rd shift . Gets off 6-7 Am , sleeps noon to 5 pm  Has noted LE swelling , that she attributes  Has mild nausea but no vomiting She has  dumping syndrome     HOME DIABETES REGIMEN: Tradjenta 5 mg daily Pioglitazone 15 mg daily Humalog ( BG-130/45) TIDQAC     Statin: no ACE-I/ARB: yes    CONTINUOUS GLUCOSE MONITORING RECORD INTERPRETATION    Dates of Recording: 5/8 - 01/02/2023  Sensor description: Dexcom   Results statistics:   CGM use % of time 93  Average and SD 192/74  Time in range     43   %  % Time Above 180 31  % Time above 250 22  % Time Below target 2   Glycemic patterns summary: Patient has been noted with hypoglycemia during the day and night  Hyperglycemic episodes postprandial  Hypoglycemic episodes occurred after bolus  Overnight periods: High    DIABETIC COMPLICATIONS: Microvascular complications:  Neuropathy Denies: CKD , retinopathy Last eye exam: Completed 09/2022  Macrovascular complications:   Denies: CAD, PVD, CVA   PAST HISTORY: Past Medical History:  Past Medical History:  Diagnosis Date   Diabetes mellitus without complication (HCC)    Hypertension    Thyroid disease    Past Surgical History:  Past Surgical History:  Procedure Laterality Date   BREAST CYST ASPIRATION Left    neg   TUBAL LIGATION      Social History:  reports that she has been smoking cigarettes. She has never used smokeless tobacco. She reports current alcohol use. No history on file  for drug use. Family History:  Family History  Problem Relation Age of Onset   Breast cancer Maternal Aunt      HOME MEDICATIONS: Allergies as of 01/02/2023       Reactions   Sulfa Antibiotics Rash, Itching, Swelling   Sulfasalazine Rash        Medication List        Accurate as of Jan 02, 2023  2:37 PM. If you have any questions, ask your nurse or doctor.          STOP taking these medications    Tradjenta 5 MG Tabs tablet Generic drug: linagliptin Stopped by: Scarlette Shorts, MD       TAKE these medications    alendronate 70 MG tablet Commonly known as: FOSAMAX Take 70  mg by mouth once a week.   amLODipine 5 MG tablet Commonly known as: NORVASC Take 5 mg by mouth daily.   azaTHIOprine 50 MG tablet Commonly known as: IMURAN Take 50 mg by mouth daily. Take 1 tablet morning and 2 tabs in evening   Creon 36000 UNITS Cpep capsule Generic drug: lipase/protease/amylase Take 2 capsules by mouth 3 (three) times daily with meals.   Dexcom G7 Receiver Devi Use to check blood sugars   Dexcom G7 Sensor Misc 1 Device by Does not apply route as directed.   fluticasone 50 MCG/ACT nasal spray Commonly known as: FLONASE Place 2 sprays into both nostrils as needed.   hydroxychloroquine 200 MG tablet Commonly known as: PLAQUENIL Take 200 mg by mouth daily.   insulin glargine 100 UNIT/ML Solostar Pen Commonly known as: LANTUS Inject 10 Units into the skin daily. Started by: Scarlette Shorts, MD   insulin lispro 100 UNIT/ML KwikPen Commonly known as: HUMALOG Max Daily 30 units   Insulin Pen Needle 32G X 4 MM Misc 1 Device by Does not apply route in the morning, at noon, in the evening, and at bedtime. What changed: when to take this Changed by: Scarlette Shorts, MD   levothyroxine 25 MCG tablet Commonly known as: SYNTHROID Take 25 mcg by mouth daily before breakfast.   levothyroxine 200 MCG tablet Commonly known as: SYNTHROID Take 200 mcg by mouth daily before breakfast.   Microlet Lancets Misc   pioglitazone 15 MG tablet Commonly known as: Actos Take 1 tablet (15 mg total) by mouth daily.   Urogesic-Blue 81.6 MG Tabs Take 1 tablet (81.6 mg total) by mouth every 8 (eight) hours as needed.   Vitamin D (Ergocalciferol) 1.25 MG (50000 UNIT) Caps capsule Commonly known as: DRISDOL Take 50,000 Units by mouth once a week.         ALLERGIES: Allergies  Allergen Reactions   Sulfa Antibiotics Rash, Itching and Swelling   Sulfasalazine Rash     REVIEW OF SYSTEMS: A comprehensive ROS was conducted with the patient and is negative  except as per HPI  OBJECTIVE:   VITAL SIGNS: BP 124/72 (BP Location: Left Arm, Patient Position: Sitting, Cuff Size: Small)   Pulse 75   Ht 5\' 5"  (1.651 m)   Wt 130 lb (59 kg)   SpO2 96%   BMI 21.63 kg/m    PHYSICAL EXAM:  General: Pt appears well and is in NAD  Lungs: Clear with good BS bilat   Heart: RRR   Abdomen:  soft, nontender  Extremities:  Lower extremities - No pretibial edema.  Neuro: MS is good with appropriate affect, pt is alert and Ox3     DATA  REVIEWED: 09/14/2022  GFR 102  Latest Reference Range & Units 10/19/22 09:25  Glutamic Acid Decarb Ab 0.0 - 5.0 U/mL 1,301.9 (H)  Islet Cell Ab Neg:<1:1  Negative  (H): Data is abnormally high  ASSESSMENT / PLAN / RECOMMENDATIONS:   1) Type 1 Diabetes Mellitus, Poorly  controlled, With neuropathic complications -A1c 8.4% ,goal A1c < 7.0 %.    -Patient with elevated GAD-65 -Patient would like to avoid any further weight loss, hence we avoided SGLT2 inhibitors and GLP-1 agonists .  -Tolerating pioglitazone , would not change  Dexcom sent to pharmacy  -She has been noted with hypoglycemia even during sleep hours, I have recommended starting basal insulin -Will stop Tradjenta as I do not believe this has helped her -I will also change her sensitivity factor from 45 to 50 -She does have a granddaughter who is on an insulin pump, will consider in the future  MEDICATIONS: Stop Tradjenta 5 mg daily  Continue pioglitazone 15 mg daily  Change CF: Humalog (BG -130/50) TIDQAC Start Lantus 10 units daily   EDUCATION / INSTRUCTIONS: BG monitoring instructions: Patient is instructed to check her blood sugars 3 times a day, before meals. Call [facility] if: BG persistently < 70  I reviewed the Rule of 15 for the treatment of hypoglycemia in detail with the patient. Literature supplied.   2) Diabetic complications:  Eye: Does not have known diabetic retinopathy.  Neuro/ Feet: Does not have known diabetic  peripheral neuropathy. Renal: Patient does not have known baseline CKD. She is not on an ACEI/ARB at present.   Follow-up in 6 months   Signed electronically by: Lyndle Herrlich, MD  [facility] Endocrinology  [facility] [address] [place] [place], [place] [address] Phone: [phone] FAX: [phone]   CC: Center, [facility] [address] [place] [address] Phone: [phone]  Fax: [phone]    Return to Endocrinology clinic as below: Future Appointments  Date Time Provider Department Center  02/06/2023  7:20 AM [facility]  07/05/2023  8:10 AM Tru Leopard, Konrad Dolores, MD [facility] None

## 2023-01-02 ENCOUNTER — Telehealth: Payer: Self-pay | Admitting: Internal Medicine

## 2023-01-02 ENCOUNTER — Encounter: Payer: Self-pay | Admitting: Internal Medicine

## 2023-01-02 ENCOUNTER — Ambulatory Visit (INDEPENDENT_AMBULATORY_CARE_PROVIDER_SITE_OTHER): Payer: Managed Care, Other (non HMO) | Admitting: Internal Medicine

## 2023-01-02 VITALS — BP 124/72 | HR 75 | Ht 65.0 in | Wt 130.0 lb

## 2023-01-02 DIAGNOSIS — E1065 Type 1 diabetes mellitus with hyperglycemia: Secondary | ICD-10-CM

## 2023-01-02 DIAGNOSIS — Z9884 Bariatric surgery status: Secondary | ICD-10-CM | POA: Diagnosis not present

## 2023-01-02 DIAGNOSIS — E139 Other specified diabetes mellitus without complications: Secondary | ICD-10-CM

## 2023-01-02 LAB — POCT GLYCOSYLATED HEMOGLOBIN (HGB A1C): Hemoglobin A1C: 8.4 % — AB (ref 4.0–5.6)

## 2023-01-02 MED ORDER — INSULIN PEN NEEDLE 32G X 4 MM MISC: 1.0000 | Freq: Four times a day (QID) | 3 refills | Status: AC

## 2023-01-02 MED ORDER — INSULIN GLARGINE 100 UNIT/ML SOLOSTAR PEN: 10.0000 [IU] | PEN_INJECTOR | Freq: Every day | SUBCUTANEOUS | 3 refills | Status: DC

## 2023-01-02 MED ORDER — INSULIN PEN NEEDLE 32G X 4 MM MISC: 1.0000 | Freq: Four times a day (QID) | 3 refills | Status: DC

## 2023-01-02 NOTE — Patient Instructions (Addendum)
Stop  Tradjenta 5 mg daily  Continue Pioglitazone 15 mg daily  Start lantus 10 units once daily   Humalog correctional insulin:Use the scale below to help guide you before each meal   Blood sugar before meal Number of units to inject  Less than 180 0 unit  181 - 230 1 units  231 - 280 2 units  281 - 330 3 units  331 - 380 4 units  381 - 430 5 units  431 - 480 6 units    HOW TO TREAT LOW BLOOD SUGARS (Blood sugar LESS THAN 70 MG/DL) Please follow the RULE OF 15 for the treatment of hypoglycemia treatment (when your (blood sugars are less than 70 mg/dL)   STEP 1: Take 15 grams of carbohydrates when your blood sugar is low, which includes:  3-4 GLUCOSE TABS  OR 3-4 OZ OF JUICE OR REGULAR SODA OR ONE TUBE OF GLUCOSE GEL    STEP 2: RECHECK blood sugar in 15 MINUTES STEP 3: If your blood sugar is still low at the 15 minute recheck --> then, go back to STEP 1 and treat AGAIN with another 15 grams of carbohydrates.

## 2023-01-02 NOTE — Telephone Encounter (Signed)
Patient called to advise that she would like her Lantus and Pen Needles sent to [facility].  She has to fill RX with them due to insurance coverage.

## 2023-01-03 ENCOUNTER — Encounter: Payer: Self-pay | Admitting: Internal Medicine

## 2023-02-06 ENCOUNTER — Ambulatory Visit
Admission: RE | Admit: 2023-02-06 | Discharge: 2023-02-06 | Disposition: A | Discharge status: Home / Self Care | Payer: Managed Care, Other (non HMO) | Source: Ambulatory Visit | Attending: Family Medicine | Admitting: Family Medicine

## 2023-02-06 DIAGNOSIS — Z1231 Encounter for screening mammogram for malignant neoplasm of breast: Secondary | ICD-10-CM | POA: Insufficient documentation

## 2023-04-18 ENCOUNTER — Other Ambulatory Visit: Payer: Self-pay

## 2023-04-18 MED ORDER — INSULIN GLARGINE 100 UNIT/ML SOLOSTAR PEN: 10.0000 [IU] | PEN_INJECTOR | Freq: Every day | SUBCUTANEOUS | 1 refills | Status: DC

## 2023-07-05 ENCOUNTER — Ambulatory Visit: Payer: Managed Care, Other (non HMO) | Admitting: Internal Medicine

## 2023-07-05 NOTE — Progress Notes (Deleted)
Name: Tammy Costa  MRN/ DOB: [id number], 03/13/1971   Age/ Sex: 52 y.o., female    PCP: Center, [facility]   Reason for Endocrinology Evaluation: Type 1 Diabetes Mellitus     Date of Initial Endocrinology Visit: 10/19/2022    PATIENT IDENTIFIER: Tammy Costa is a 52 y.o. 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 The patient presented for initial endocrinology clinic visit on 10/19/2022 for consultative assistance with her diabetes management.    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   GAD -74 which was elevated at 1301.9 U/mL 10/2022  On her initial visit to our clinic we were unable to do an A1c at the office as she just had it done through her PCPs office, these records were not available even though the patient signed release of information forms    THYROID HISTORY   Pt with hx of RAI ablation for Graves' disease may years ago, she has been on LT-4 replacement.      SUBJECTIVE:   During the last visit (01/02/2023): A1c 8.4%    Today (07/05/23): Tammy Costa is here for follow-up on diabetes management.  She checks her blood sugars multiple times daily. The patient has  had hypoglycemic episodes since the last clinic visit, she is symptoms with these episodes.     Patient with chronic diarrhea  Patient follows with rheumatology for osteoporosis and oral lichen planus She was seen by Ortho for left wrist DeQuervain , received  intra-articular glucocorticoid injection 02/2023  Works 3rd shift . Gets off 6-7 Am , sleeps noon to 5 pm  Has noted LE swelling , that she attributes  Has mild nausea but no vomiting    HOME DIABETES REGIMEN: Pioglitazone 15 mg daily Humalog ( BG-130/45) TIDQAC Lantus 10 units daily      Statin: no ACE-I/ARB: yes    CONTINUOUS GLUCOSE MONITORING RECORD INTERPRETATION    Dates of Recording: 5/8 - 01/02/2023  Sensor description: Dexcom   Results statistics:   CGM use % of time 93  Average and SD 192/74  Time in range     43   %  % Time Above 180 31  % Time above 250 22  % Time Below target 2   Glycemic patterns summary: Patient has been noted with hypoglycemia during the day and night  Hyperglycemic episodes postprandial  Hypoglycemic episodes occurred after bolus  Overnight periods: High    DIABETIC COMPLICATIONS: Microvascular complications:  Neuropathy Denies: CKD , retinopathy Last eye exam: Completed 09/2022  Macrovascular complications:   Denies: CAD, PVD, CVA   PAST HISTORY: Past Medical History:  Past Medical History:  Diagnosis Date   Diabetes mellitus without complication (HCC)    Hypertension    Thyroid disease    Past Surgical History:  Past Surgical History:  Procedure Laterality Date   BREAST CYST ASPIRATION Left    neg   TUBAL LIGATION  Social History:  reports that she has been smoking cigarettes. She has never used smokeless tobacco. She reports current alcohol use. No history on file for drug use. Family History:  Family History  Problem Relation Age of Onset   Breast cancer Maternal Aunt      HOME MEDICATIONS: Allergies as of 07/05/2023       Reactions   Sulfa Antibiotics Rash, Itching, Swelling   Sulfasalazine Rash        Medication List        Accurate as of July 05, 2023  6:54 AM. If you have any questions, ask your nurse or doctor.          alendronate 70 MG tablet Commonly known as:  FOSAMAX Take 70 mg by mouth once a week.   amLODipine 5 MG tablet Commonly known as: NORVASC Take 5 mg by mouth daily.   azaTHIOprine 50 MG tablet Commonly known as: IMURAN Take 50 mg by mouth daily. Take 1 tablet morning and 2 tabs in evening   Creon 36000-114000 units Cpep capsule Generic drug: lipase/protease/amylase Take 2 capsules by mouth 3 (three) times daily with meals.   Dexcom G7 Sensor Misc 1 Device by Does not apply route as directed.   fluticasone 50 MCG/ACT nasal spray Commonly known as: FLONASE Place 2 sprays into both nostrils as needed.   hydroxychloroquine 200 MG tablet Commonly known as: PLAQUENIL Take 200 mg by mouth daily.   insulin glargine 100 UNIT/ML Solostar Pen Commonly known as: LANTUS Inject 10 Units into the skin daily.   insulin lispro 100 UNIT/ML KwikPen Commonly known as: HUMALOG Max Daily 30 units   Insulin Pen Needle 32G X 4 MM Misc 1 Device by Does not apply route in the morning, at noon, in the evening, and at bedtime.   levothyroxine 25 MCG tablet Commonly known as: SYNTHROID Take 25 mcg by mouth daily before breakfast.   levothyroxine 200 MCG tablet Commonly known as: SYNTHROID Take 200 mcg by mouth daily before breakfast.   Microlet Lancets Misc   pioglitazone 15 MG tablet Commonly known as: Actos Take 1 tablet (15 mg total) by mouth daily.   Urogesic-Blue 81.6 MG Tabs Take 1 tablet (81.6 mg total) by mouth every 8 (eight) hours as needed.   Vitamin D (Ergocalciferol) 1.25 MG (50000 UNIT) Caps capsule Commonly known as: DRISDOL Take 50,000 Units by mouth once a week.         ALLERGIES: Allergies  Allergen Reactions   Sulfa Antibiotics Rash, Itching and Swelling   Sulfasalazine Rash     REVIEW OF SYSTEMS: A comprehensive ROS was conducted with the patient and is negative except as per HPI  OBJECTIVE:   VITAL SIGNS: There were no vitals taken for this visit.   PHYSICAL EXAM:  General: Pt appears well  and is in NAD  Lungs: Clear with good BS bilat   Heart: RRR   Abdomen:  soft, nontender  Extremities:  Lower extremities - No pretibial edema.  Neuro: MS is good with appropriate affect, pt is alert and Ox3     DATA REVIEWED: 09/14/2022  GFR 102  Latest Reference Range & Units 10/19/22 09:25  Glutamic Acid Decarb Ab 0.0 - 5.0 U/mL 1,301.9 (H)  Islet Cell Ab Neg:<1:1  Negative   ASSESSMENT / PLAN / RECOMMENDATIONS:   1) Type 1 Diabetes Mellitus, Poorly  controlled, With neuropathic complications -A1c 8.4% ,goal A1c < 7.0 %.    -Patient with elevated GAD-65 -Patient would like to  avoid any further weight loss, hence we avoided SGLT2 inhibitors and GLP-1 agonists .  -Tolerating pioglitazone , would not change  Dexcom sent to pharmacy  -She has been noted with hypoglycemia even during sleep hours, I have recommended starting basal insulin -Will stop Tradjenta as I do not believe this has helped her -I will also change her sensitivity factor from 45 to 50 -She does have a granddaughter who is on an insulin pump, will consider in the future  MEDICATIONS: Stop Tradjenta 5 mg daily  Continue pioglitazone 15 mg daily  Change CF: Humalog (BG -130/50) TIDQAC Start Lantus 10 units daily   EDUCATION / INSTRUCTIONS: BG monitoring instructions: Patient is instructed to check her blood sugars 3 times a day, before meals. Call [facility] if: BG persistently < 70  I reviewed the Rule of 15 for the treatment of hypoglycemia in detail with the patient. Literature supplied.   2) Diabetic complications:  Eye: Does not have known diabetic retinopathy.  Neuro/ Feet: Does not have known diabetic peripheral neuropathy. Renal: Patient does not have known baseline CKD. She is not on an ACEI/ARB at present.   Follow-up in 6 months   Signed electronically by: Lyndle Herrlich, MD  [facility] Endocrinology  [facility] [address] [place] [place], [place] [address] Phone: [phone] FAX: [phone]   CC: Center, [facility] [address] [place] [address] Phone: [phone]  Fax: [phone]    Return to Endocrinology clinic as below: Future Appointments  Date Time Provider Department Center  07/05/2023  8:10 AM Rosemarie Galvis, Konrad Dolores, MD [facility] None

## 2023-07-25 ENCOUNTER — Other Ambulatory Visit: Payer: Self-pay | Admitting: Internal Medicine

## 2023-09-17 ENCOUNTER — Other Ambulatory Visit: Payer: Self-pay | Admitting: Internal Medicine

## 2023-10-28 ENCOUNTER — Other Ambulatory Visit: Payer: Self-pay | Admitting: Internal Medicine

## 2023-11-06 ENCOUNTER — Other Ambulatory Visit: Payer: Self-pay | Admitting: Internal Medicine

## 2023-11-16 ENCOUNTER — Other Ambulatory Visit: Payer: Self-pay | Admitting: Internal Medicine

## 2023-12-13 ENCOUNTER — Other Ambulatory Visit: Payer: Self-pay | Admitting: Family Medicine

## 2023-12-13 DIAGNOSIS — Z1231 Encounter for screening mammogram for malignant neoplasm of breast: Secondary | ICD-10-CM

## 2024-01-11 ENCOUNTER — Other Ambulatory Visit: Payer: Self-pay | Admitting: Internal Medicine

## 2024-01-31 ENCOUNTER — Other Ambulatory Visit: Payer: Self-pay | Admitting: Internal Medicine

## 2024-02-07 ENCOUNTER — Ambulatory Visit
Admission: RE | Admit: 2024-02-07 | Discharge: 2024-02-07 | Disposition: A | Discharge status: Home / Self Care | Source: Ambulatory Visit | Attending: Family Medicine | Admitting: Family Medicine

## 2024-02-07 DIAGNOSIS — Z1231 Encounter for screening mammogram for malignant neoplasm of breast: Secondary | ICD-10-CM | POA: Insufficient documentation

## 2024-02-11 ENCOUNTER — Other Ambulatory Visit: Payer: Self-pay | Admitting: Internal Medicine

## 2024-06-21 ENCOUNTER — Other Ambulatory Visit: Payer: Self-pay | Admitting: Internal Medicine

## 2024-07-25 ENCOUNTER — Other Ambulatory Visit: Payer: Self-pay | Admitting: Internal Medicine

## 2024-08-24 ENCOUNTER — Other Ambulatory Visit: Payer: Self-pay | Admitting: Internal Medicine
